# Patient Record
Sex: Male | Born: 1935 | Race: White | Hispanic: No | Marital: Married | Smoking: Never smoker
Health system: Southern US, Community
[De-identification: ages and names within clinical notes are randomized; demographics above are authoritative.]

## PROBLEM LIST (undated history)

## (undated) DIAGNOSIS — N400 Enlarged prostate without lower urinary tract symptoms: Secondary | ICD-10-CM

## (undated) DIAGNOSIS — E7800 Pure hypercholesterolemia, unspecified: Secondary | ICD-10-CM

## (undated) DIAGNOSIS — S065X9A Traumatic subdural hemorrhage with loss of consciousness of unspecified duration, initial encounter: Secondary | ICD-10-CM

## (undated) DIAGNOSIS — H409 Unspecified glaucoma: Secondary | ICD-10-CM

## (undated) DIAGNOSIS — N200 Calculus of kidney: Secondary | ICD-10-CM

## (undated) DIAGNOSIS — I10 Essential (primary) hypertension: Secondary | ICD-10-CM

## (undated) DIAGNOSIS — D751 Secondary polycythemia: Secondary | ICD-10-CM

## (undated) DIAGNOSIS — F0781 Postconcussional syndrome: Secondary | ICD-10-CM

## (undated) HISTORY — DX: Benign prostatic hyperplasia without lower urinary tract symptoms: N40.0

## (undated) HISTORY — DX: Secondary polycythemia: D75.1

## (undated) HISTORY — DX: Unspecified glaucoma: H40.9

## (undated) HISTORY — PX: CATARACT EXTRACTION: SUR2

## (undated) HISTORY — PX: PAROTIDECTOMY: SUR1003

## (undated) HISTORY — DX: Traumatic subdural hemorrhage with loss of consciousness of unspecified duration, initial encounter: S06.5X9A

## (undated) HISTORY — PX: REFRACTIVE SURGERY: SHX103

## (undated) HISTORY — DX: Postconcussional syndrome: F07.81

## (undated) HISTORY — DX: Pure hypercholesterolemia, unspecified: E78.00

## (undated) HISTORY — DX: Essential (primary) hypertension: I10

## (undated) HISTORY — DX: Calculus of kidney: N20.0

## (undated) HISTORY — PX: COLONOSCOPY: SHX174

## (undated) HISTORY — PX: OTHER SURGICAL HISTORY: SHX169

---
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
## 1998-03-08 HISTORY — PX: KIDNEY STONE SURGERY: SHX686

## 2014-03-08 HISTORY — PX: VEIN LIGATION: SHX2652

## 2015-11-07 ENCOUNTER — Encounter: Payer: Self-pay | Admitting: Neurology

## 2015-11-07 ENCOUNTER — Ambulatory Visit (INDEPENDENT_AMBULATORY_CARE_PROVIDER_SITE_OTHER): Payer: Medicare Other | Admitting: Neurology

## 2015-11-07 VITALS — BP 148/71 | HR 63 | Ht 69.0 in | Wt 165.5 lb

## 2015-11-07 DIAGNOSIS — E538 Deficiency of other specified B group vitamins: Secondary | ICD-10-CM

## 2015-11-07 DIAGNOSIS — S065XAA Traumatic subdural hemorrhage with loss of consciousness status unknown, initial encounter: Secondary | ICD-10-CM | POA: Insufficient documentation

## 2015-11-07 DIAGNOSIS — F0781 Postconcussional syndrome: Secondary | ICD-10-CM | POA: Diagnosis not present

## 2015-11-07 DIAGNOSIS — I6200 Nontraumatic subdural hemorrhage, unspecified: Secondary | ICD-10-CM

## 2015-11-07 DIAGNOSIS — S065X9A Traumatic subdural hemorrhage with loss of consciousness of unspecified duration, initial encounter: Secondary | ICD-10-CM

## 2015-11-07 DIAGNOSIS — R269 Unspecified abnormalities of gait and mobility: Secondary | ICD-10-CM

## 2015-11-07 HISTORY — DX: Postconcussional syndrome: F07.81

## 2015-11-07 HISTORY — DX: Traumatic subdural hemorrhage with loss of consciousness of unspecified duration, initial encounter: S06.5X9A

## 2015-11-07 HISTORY — DX: Traumatic subdural hemorrhage with loss of consciousness status unknown, initial encounter: S06.5XAA

## 2015-11-07 NOTE — Progress Notes (Signed)
Reason for visit: Concussion  Referring physician: Dr. Dorris Singh GENTLE HOGE is a 81 y.o. male  History of present illness:  Mr. Delapena is a 81 year old left-handed white male with a history of a fall that occurred around 08/03/2015. The patient was helping to move a potted plant, and fell down some stairs, he struck his right brow. The patient did not lose consciousness, but he was somewhat dazed for about 15 minutes. The patient went to the hospital and a CT scan of the head was done and apparently was unremarkable according to the patient. The patient began having onset of true vertigo the following day, and returned to emergency room but another CT scan was not done. The patient had some nausea associated with this, but he denied any headache. The patient has been treated with meclizine and Epley maneuvers. The true vertigo has gone away, but the patient has a sensation of dizziness or imbalance with walking, particularly when he is making a turn. He has not had any falls. Within the last week or so, he has begun to have headaches in the right occipital area. The patient has not had any overt confusion or memory problems, he has had some difficulty sleeping within the last 1 or 2 weeks. He denies any numbness or weakness of the face, arms, or legs, but he does have a sensation of some clumsiness of the legs. The patient denies any issues controlling the bowels or the bladder. The patient is not operating a motor vehicle at this time. The patient underwent a MRI of the brain on 09/18/2015, the written report is available to me. 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 The patient is sent to this office for further evaluation. The patient has been taken off of his aspirin.   Past Medical  History:  Diagnosis Date  . High cholesterol   . Hypertension     Past Surgical History:  Procedure Laterality Date  . BRAIN SURGERY  1949  . CATARACT EXTRACTION  2013-14  . KIDNEY STONE SURGERY  2000  . TONSILLECTOMY  1944  . VEIN LIGATION  2016    Family History  Problem Relation Age of Onset  . High blood pressure Mother   . High Cholesterol Mother     Social history:  reports that he has never smoked. He has never used smokeless tobacco. He reports that he does not drink alcohol or use drugs.  Medications:  Prior to Admission medications   Medication Sig Start Date End Date Taking? Authorizing Provider  amLODipine (NORVASC) 2.5 MG tablet  08/12/15  Yes Historical Provider, MD  lisinopril (PRINIVIL,ZESTRIL) 20 MG tablet Take 20 mg by mouth daily.   Yes Historical Provider, MD  meclizine (ANTIVERT) 12.5 MG tablet  09/29/15  Yes Historical Provider, MD  rosuvastatin (CRESTOR) 20 MG tablet  09/22/15  Yes Historical Provider, MD     Not on File  ROS:  Out of a complete 14 system review of symptoms, the patient complains only of the following symptoms, and all other reviewed systems are negative.  Double vision Easy bleeding Headache, dizziness Insomnia  Blood pressure (!) 148/71, pulse 63, height 5\' 9"  (1.753 m), weight 165 lb 8 oz (75.1 kg).  Physical Exam  General: The patient is  alert and cooperative at the time of the examination.  Eyes: Pupils are equal, round, and reactive to light. Discs are flat bilaterally.  Neck: The neck is supple, no carotid bruits are noted.  Respiratory: The respiratory examination is clear.  Cardiovascular: The cardiovascular examination reveals a regular rate and rhythm, no obvious murmurs or rubs are noted.  Skin: Extremities are without significant edema.  Neurologic Exam  Mental status: The patient is alert and oriented x 3 at the time of the examination. The patient has apparent normal recent and remote memory, with an  apparently normal attention span and concentration ability.  Cranial nerves: Facial symmetry is present. There is good sensation of the face to pinprick and soft touch bilaterally. The strength of the facial muscles and the muscles to head turning and shoulder shrug are normal bilaterally. Speech is well enunciated, no aphasia or dysarthria is noted. Extraocular movements are full. Visual fields are full. The tongue is midline, and the patient has symmetric elevation of the soft palate. No obvious hearing deficits are noted.  Motor: The motor testing reveals 5 over 5 strength of all 4 extremities. Good symmetric motor tone is noted throughout.  Sensory: Sensory testing is intact to pinprick, soft touch, vibration sensation, and position sense on all 4 extremities, with exception of some decreased position sense in the left foot. No evidence of extinction is noted.  Coordination: Cerebellar testing reveals good finger-nose-finger and heel-to-shin bilaterally. The patient appears to have apraxia with the use of the extremities, particularly the lower extremities.  Gait and station: Gait is normal. Tandem gait is slightly unsteady. Romberg is negative. No drift is seen.  Reflexes: Deep tendon reflexes are symmetric and normal bilaterally. Toes are downgoing bilaterally.   Assessment/Plan:  1. Post concussive syndrome  2. Post concussive vertigo, resolved  3. Gait instability, sensation of imbalance  4. Bilateral subdural hematoma  The patient has developed onset of a right occipital headache within the last week, this may indicate an enlargement of the subdural hematoma. The patient will be sent back for a CT of the head. He will be sent for physical therapy for gait training. His reports of dizziness appear to correlate with a sensation of imbalance, not true vertigo. He will be sent for some blood work looking for metabolic etiologies of gait disturbances. The patient will follow-up in 2  months. I have recommended that he use a cane for ambulation outside the house.  Marlan Palau. Keith Treylan Mcclintock MD 11/07/2015 8:16 AM  [facility] [address] [place], [place] [id number]  Phone [phone] Fax [phone]

## 2015-11-12 LAB — COPPER, SERUM: COPPER: 71 ug/dL — AB (ref 72–166)

## 2015-11-12 LAB — METHYLMALONIC ACID, SERUM: Methylmalonic Acid: 240 nmol/L (ref 0–378)

## 2015-11-12 LAB — VITAMIN B12: VITAMIN B 12: 320 pg/mL (ref 211–946)

## 2015-11-13 ENCOUNTER — Telehealth: Payer: Self-pay

## 2015-11-13 NOTE — Telephone Encounter (Signed)
Called pt w/ unremarkable lab results. Advised to start OTC MVI w/ copper supplement. Verbalized understanding and appreciation for call. Lab results and instructions mailed to pt as requested.

## 2015-11-13 NOTE — Telephone Encounter (Signed)
-----   Message from York Spanielharles K Willis, MD sent at 11/12/2015  5:29 PM EDT ----- Blood work is essentially unremarkable, minimally low copper level, would recommend going on a multivitamin with copper supplementation. Please call the patient. ----- Message ----- From: Nell RangeInterface, [facility] Lab Results In Sent: 11/08/2015   7:41 AM To: York Spanielharles K Willis, MD

## 2015-11-14 ENCOUNTER — Encounter: Payer: Self-pay | Admitting: Neurology

## 2015-11-14 ENCOUNTER — Encounter (HOSPITAL_COMMUNITY): Payer: Self-pay | Admitting: *Deleted

## 2015-11-14 ENCOUNTER — Inpatient Hospital Stay (HOSPITAL_COMMUNITY)
Admission: EM | Admit: 2015-11-14 | Discharge: 2015-11-19 | DRG: 027 | Disposition: A | Discharge status: Home / Self Care | Payer: Medicare Other | Attending: Neurosurgery | Admitting: Neurosurgery

## 2015-11-14 ENCOUNTER — Emergency Department (HOSPITAL_COMMUNITY): Payer: Medicare Other

## 2015-11-14 ENCOUNTER — Telehealth: Payer: Self-pay | Admitting: Neurology

## 2015-11-14 DIAGNOSIS — I6203 Nontraumatic chronic subdural hemorrhage: Secondary | ICD-10-CM | POA: Diagnosis not present

## 2015-11-14 DIAGNOSIS — R402143 Coma scale, eyes open, spontaneous, at hospital admission: Secondary | ICD-10-CM | POA: Diagnosis present

## 2015-11-14 DIAGNOSIS — I10 Essential (primary) hypertension: Secondary | ICD-10-CM | POA: Diagnosis present

## 2015-11-14 DIAGNOSIS — R402363 Coma scale, best motor response, obeys commands, at hospital admission: Secondary | ICD-10-CM | POA: Diagnosis present

## 2015-11-14 DIAGNOSIS — E7800 Pure hypercholesterolemia, unspecified: Secondary | ICD-10-CM | POA: Diagnosis present

## 2015-11-14 DIAGNOSIS — S065X9A Traumatic subdural hemorrhage with loss of consciousness of unspecified duration, initial encounter: Secondary | ICD-10-CM | POA: Diagnosis present

## 2015-11-14 DIAGNOSIS — R4182 Altered mental status, unspecified: Secondary | ICD-10-CM | POA: Diagnosis not present

## 2015-11-14 DIAGNOSIS — Z01818 Encounter for other preprocedural examination: Secondary | ICD-10-CM

## 2015-11-14 DIAGNOSIS — R402253 Coma scale, best verbal response, oriented, at hospital admission: Secondary | ICD-10-CM | POA: Diagnosis present

## 2015-11-14 DIAGNOSIS — S065XAA Traumatic subdural hemorrhage with loss of consciousness status unknown, initial encounter: Secondary | ICD-10-CM | POA: Diagnosis present

## 2015-11-14 LAB — COMPREHENSIVE METABOLIC PANEL
ALBUMIN: 3.8 g/dL (ref 3.5–5.0)
ALK PHOS: 70 U/L (ref 38–126)
ALT: 19 U/L (ref 17–63)
AST: 23 U/L (ref 15–41)
Anion gap: 8 (ref 5–15)
BUN: 16 mg/dL (ref 6–20)
CALCIUM: 9.4 mg/dL (ref 8.9–10.3)
CO2: 25 mmol/L (ref 22–32)
CREATININE: 1.08 mg/dL (ref 0.61–1.24)
Chloride: 105 mmol/L (ref 101–111)
GFR calc Af Amer: >60 mL/min (ref >60)
GFR calc non Af Amer: >60 mL/min (ref >60)
GLUCOSE: 85 mg/dL (ref 65–99)
Potassium: 4.2 mmol/L (ref 3.5–5.1)
SODIUM: 138 mmol/L (ref 135–145)
Total Bilirubin: 1 mg/dL (ref 0.3–1.2)
Total Protein: 6.4 g/dL — ABNORMAL LOW (ref 6.5–8.1)

## 2015-11-14 LAB — CBC WITH DIFFERENTIAL/PLATELET
BASOS PCT: 0 %
Basophils Absolute: 0 10*3/uL (ref 0.0–0.1)
EOS ABS: 0.1 10*3/uL (ref 0.0–0.7)
Eosinophils Relative: 1 %
HCT: 44.5 % (ref 39.0–52.0)
HEMOGLOBIN: 15.4 g/dL (ref 13.0–17.0)
LYMPHS ABS: 1.9 10*3/uL (ref 0.7–4.0)
Lymphocytes Relative: 23 %
MCH: 29.5 pg (ref 26.0–34.0)
MCHC: 34.6 g/dL (ref 30.0–36.0)
MCV: 85.2 fL (ref 78.0–100.0)
Monocytes Absolute: 0.5 10*3/uL (ref 0.1–1.0)
Monocytes Relative: 6 %
NEUTROS ABS: 5.7 10*3/uL (ref 1.7–7.7)
NEUTROS PCT: 70 %
Platelets: 266 10*3/uL (ref 150–400)
RBC: 5.22 MIL/uL (ref 4.22–5.81)
RDW: 13.9 % (ref 11.5–15.5)
WBC: 8.2 10*3/uL (ref 4.0–10.5)

## 2015-11-14 LAB — PROTIME-INR
INR: 0.96
Prothrombin Time: 12.8 seconds (ref 11.4–15.2)

## 2015-11-14 NOTE — Telephone Encounter (Addendum)
Called from [facility] for by report acute on chronic bilateral subdural hematomas. [facility] colections measured 1cm bilaterally maximal diameter now 1.9 and 1.8 cm bilaterally without midline shift but this is mild mass effect. IOn the right extends from the mid vertex to the anterior frontotemporal area. On the left extends from the vertex to the frontotemporal area do not have the images and report is verbal, this was relayed by the director of the center. No mental status changes. He is however having dull headaches, changes in gait. I have advised patient and wife to go to the emergency room and bring images.

## 2015-11-14 NOTE — ED Triage Notes (Signed)
Headaches mild  For 1-2 weeks.  He fell may 2017  And he was diagnosed with sah    For 1-2 weeks he has been dizzy with a gait problem   No confusion    His last c-t was earlier today and his wife was called and told to bring him toi the ed because he has 2 sdh now.

## 2015-11-14 NOTE — ED Provider Notes (Signed)
[facility]-EMERGENCY DEPT Provider Note   CSN: [id number] Arrival date & time: 11/14/15  1950     History   Chief Complaint Chief Complaint  Patient presents with  . Head Injury    HPI Benjamin [name] is a 81 y.o. male.  HPI Pt was seen at 2035. Per pt and his wife, c/o gradual onset and persistence of worsening "SDH" over the past 4 months. Pt fell 08/03/15 and had CT head at OSH read as negative. Pt had persistent "dizziness" over the next weeks, and f/u with PMD. PMD dx concussion. Pt returned to OSH and had repeat CT-H that was again negative. Pt f/u with Neuro MD (Dr. Anne Hahn) last month, had MRI which revealed "one small area of SDH." Pt's wife states pt has been persistently "off balance" while walking, "dizzy" (unable to further describe), and have intermittent headaches over the past 1 week. Pt had repeat CT-H today at OSH and was told afterwards by his Neuro Dr. Anne Hahn to "go to [facility] because the bleeding in his head is growing and now there are 2 areas of SDH." Pt himself currently denies any complaints while sitting on the stretcher. Denies any further falls after the original fall. Denies CP/palpitations, no SOB/cough, no abd pain, no N/V/D, no slurred speech, no facial droop, no focal motor weakness, no tingling/numbness in extremities.    Past Medical History:  Diagnosis Date  . High cholesterol   . Hypertension   . Post concussion syndrome 11/07/2015  . Subdural hematoma (HCC) 11/07/2015    Patient Active Problem List   Diagnosis Date Noted  . Post concussion syndrome 11/07/2015  . Abnormality of gait 11/07/2015  . Subdural hematoma (HCC) 11/07/2015    Past Surgical History:  Procedure Laterality Date  . BRAIN SURGERY  1949  . CATARACT EXTRACTION  2013-14  . KIDNEY STONE SURGERY  2000  . TONSILLECTOMY  1944  . VEIN LIGATION  2016       Home Medications    Prior to Admission medications   Medication Sig Start Date End Date Taking? Authorizing Provider  amLODipine  (NORVASC) 2.5 MG tablet  08/12/15   Historical Provider, MD  lisinopril (PRINIVIL,ZESTRIL) 20 MG tablet Take 20 mg by mouth daily.    Historical Provider, MD  meclizine (ANTIVERT) 12.5 MG tablet  09/29/15   Historical Provider, MD  rosuvastatin (CRESTOR) 20 MG tablet  09/22/15   Historical Provider, MD    Family History Family History  Problem Relation Age of Onset  . High blood pressure Mother   . High Cholesterol Mother     Social History Social History  Substance Use Topics  . Smoking status: Never Smoker  . Smokeless tobacco: Never Used  . Alcohol use No     Allergies   Review of patient's allergies indicates not on file.   Review of Systems Review of Systems ROS: Statement: All systems negative except as marked or noted in the HPI; Constitutional: Negative for fever and chills. ; ; Eyes: Negative for eye pain, redness and discharge. ; ; ENMT: Negative for ear pain, hoarseness, nasal congestion, sinus pressure and sore throat. ; ; Cardiovascular: Negative for chest pain, palpitations, diaphoresis, dyspnea and peripheral edema. ; ; Respiratory: Negative for cough, wheezing and stridor. ; ; Gastrointestinal: Negative for nausea, vomiting, diarrhea, abdominal pain, blood in stool, hematemesis, jaundice and rectal bleeding. . ; ; Genitourinary: Negative for dysuria, flank pain and hematuria. ; ; Musculoskeletal: Negative for back pain and neck pain. Negative for swelling  and trauma.; ; Skin: Negative for pruritus, rash, abrasions, blisters, bruising and skin lesion.; ; Neuro: +"dizziness," ataxia, headache. Negative for neck stiffness. Negative for weakness, altered level of consciousness, altered mental status, extremity weakness, paresthesias, involuntary movement, seizure and syncope.      Physical Exam Updated Vital Signs BP 145/73   Pulse (!) 58   Temp 98.3 F (36.8 C)   Resp 17   Ht 5\' 9"  (1.753 m)   Wt 166 lb 8 oz (75.5 kg)   SpO2 95%   BMI 24.59 kg/m   Physical  Exam 2040: Physical examination:  Nursing notes reviewed; Vital signs and O2 SAT reviewed;  Constitutional: Well developed, Well nourished, Well hydrated, In no acute distress; Head:  Normocephalic, atraumatic; Eyes: EOMI, PERRL, No scleral icterus; ENMT: Mouth and pharynx normal, Mucous membranes moist; Neck: Supple, Full range of motion, No lymphadenopathy; Cardiovascular: Regular rate and rhythm, No gallop; Respiratory: Breath sounds clear & equal bilaterally, No wheezes.  Speaking full sentences with ease, Normal respiratory effort/excursion; Chest: Nontender, Movement normal; Abdomen: Soft, Nontender, Nondistended, Normal bowel sounds; Genitourinary: No CVA tenderness; Extremities: Pulses normal, No tenderness, No edema, No calf edema or asymmetry.; Neuro: AA&Ox3, Major CN grossly intact. Speech clear.  No facial droop. +L>R horizontal end gaze nystagmus. Grips equal. Strength 5/5 equal bilat UE's and LE's.  DTR 2/4 equal bilat UE's and LE's.  No gross sensory deficits.  Normal cerebellar testing bilat UE's (finger-nose) and LE's (heel-shin)..; Skin: Color normal, Warm, Dry.   ED Treatments / Results  Labs (all labs ordered are listed, but only abnormal results are displayed)   EKG  EKG Interpretation None       Radiology   Procedures Procedures (including critical care time)  Medications Ordered in ED Medications - No data to display   Initial Impression / Assessment and Plan / ED Course  I have reviewed the triage vital signs and the nursing notes.  Pertinent labs & imaging results that were available during my care of the patient were reviewed by me and considered in my medical decision making (see chart for details).  MDM Reviewed: previous chart, nursing note and vitals Reviewed previous: labs Interpretation: labs and CT scan Total time providing critical care: 30-74 minutes. This excludes time spent performing separately reportable procedures and services. Consults:  neurosurgery   CRITICAL CARE Performed by: Laray Anger Total critical care time: 35 minutes Critical care time was exclusive of separately billable procedures and treating other patients. Critical care was necessary to treat or prevent imminent or life-threatening deterioration. Critical care was time spent personally by me on the following activities: development of treatment plan with patient and/or surrogate as well as nursing, discussions with consultants, evaluation of patient's response to treatment, examination of patient, obtaining history from patient or surrogate, ordering and performing treatments and interventions, ordering and review of laboratory studies, ordering and review of radiographic studies, pulse oximetry and re-evaluation of patient's condition.   Results for orders placed or performed during the hospital encounter of 11/14/15  CBC with Differential  Result Value Ref Range   WBC 8.2 4.0 - 10.5 K/uL   RBC 5.22 4.22 - 5.81 MIL/uL   Hemoglobin 15.4 13.0 - 17.0 g/dL   HCT 16.1 09.6 - 04.5 %   MCV 85.2 78.0 - 100.0 fL   MCH 29.5 26.0 - 34.0 pg   MCHC 34.6 30.0 - 36.0 g/dL   RDW 40.9 81.1 - 91.4 %   Platelets 266 150 - 400 K/uL  Neutrophils Relative % 70 %   Neutro Abs 5.7 1.7 - 7.7 K/uL   Lymphocytes Relative 23 %   Lymphs Abs 1.9 0.7 - 4.0 K/uL   Monocytes Relative 6 %   Monocytes Absolute 0.5 0.1 - 1.0 K/uL   Eosinophils Relative 1 %   Eosinophils Absolute 0.1 0.0 - 0.7 K/uL   Basophils Relative 0 %   Basophils Absolute 0.0 0.0 - 0.1 K/uL  Comprehensive metabolic panel  Result Value Ref Range   Sodium 138 135 - 145 mmol/L   Potassium 4.2 3.5 - 5.1 mmol/L   Chloride 105 101 - 111 mmol/L   CO2 25 22 - 32 mmol/L   Glucose, Bld 85 65 - 99 mg/dL   BUN 16 6 - 20 mg/dL   Creatinine, Ser 0.981.08 0.61 - 1.24 mg/dL   Calcium 9.4 8.9 - 11.910.3 mg/dL   Total Protein 6.4 (L) 6.5 - 8.1 g/dL   Albumin 3.8 3.5 - 5.0 g/dL   AST 23 15 - 41 U/L   ALT 19 17 - 63 U/L     Alkaline Phosphatase 70 38 - 126 U/L   Total Bilirubin 1.0 0.3 - 1.2 mg/dL   GFR calc non Af Amer >60 >60 mL/min   GFR calc Af Amer >60 >60 mL/min   Anion gap 8 5 - 15  Protime-INR  Result Value Ref Range   Prothrombin Time 12.8 11.4 - 15.2 seconds   INR 0.96     Ct Head Wo Contrast Result Date: 11/14/2015 CLINICAL DATA:  Frequent falls for 1 month. History of post concussion syndrome and subdural hematoma. EXAM: CT HEAD WITHOUT CONTRAST TECHNIQUE: Contiguous axial images were obtained from the base of the skull through the vertex without intravenous contrast. COMPARISON:  None. FINDINGS: BRAIN: Bilateral holo hemispheric heterogeneously dense subdural hematomas measuring up to 16 mm on the RIGHT, 15 mm on the LEFT. Linear possible membrane formation bilaterally. Subjacent sulcal effacement. No midline shift. No intraparenchymal hemorrhage or acute large vascular territory infarcts. Basal cisterns are patent. RIGHT cerebellar encephalomalacia. VASCULAR: Moderate calcific atherosclerosis of the carotid siphons. SKULL: No skull fracture. No significant scalp soft tissue swelling. SINUSES/ORBITS: The included ocular globes and orbital contents are non-suspicious. Status post bilateral ocular lens implants. The mastoid air-cells and included paranasal sinuses are well-aerated. OTHER: None. IMPRESSION: Acute on chronic large bilateral holo hemispheric subdural hematomas with similar mass effect, no midline shift. 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 Acute findings discussed with and reconfirmed by Dr.Jeyda Siebel on 11/14/2015 at 10:45 pm. Electronically Signed   By: Awilda Metroourtnay  Bloomer M.D.   On: 11/14/2015 22:49     2315:  CT scan as above.  Dx and testing d/w pt and family.  Questions answered.  Verb understanding, agreeable to admit.  T/C to Neurosurgery Dr. Wynetta Emeryram, case discussed, including:  HPI, pertinent PM/SHx, VS/PE, dx testing, ED course and treatment: will review CT images  and come to ED for evaluation.  2345:  Neurosurgery Dr. Wynetta Emeryram has come to the ED for evaluation: he will admit pt to ICU/18M.      Final Clinical Impressions(s) / ED Diagnoses   Final diagnoses:  None    New Prescriptions New Prescriptions   No medications on file     Samuel JesterKathleen Mina Babula, DO 11/18/15 1637

## 2015-11-15 ENCOUNTER — Encounter (HOSPITAL_COMMUNITY)
Admission: EM | Disposition: A | Discharge status: Home / Self Care | Payer: Self-pay | Source: Home / Self Care | Attending: Neurosurgery

## 2015-11-15 ENCOUNTER — Encounter (HOSPITAL_COMMUNITY): Payer: Self-pay | Admitting: Certified Registered"

## 2015-11-15 ENCOUNTER — Inpatient Hospital Stay (HOSPITAL_COMMUNITY): Payer: Medicare Other | Admitting: Certified Registered"

## 2015-11-15 ENCOUNTER — Inpatient Hospital Stay (HOSPITAL_COMMUNITY): Payer: Medicare Other

## 2015-11-15 DIAGNOSIS — S065XAA Traumatic subdural hemorrhage with loss of consciousness status unknown, initial encounter: Secondary | ICD-10-CM | POA: Diagnosis present

## 2015-11-15 DIAGNOSIS — R402363 Coma scale, best motor response, obeys commands, at hospital admission: Secondary | ICD-10-CM | POA: Diagnosis present

## 2015-11-15 DIAGNOSIS — I6203 Nontraumatic chronic subdural hemorrhage: Secondary | ICD-10-CM | POA: Diagnosis present

## 2015-11-15 DIAGNOSIS — S065X9A Traumatic subdural hemorrhage with loss of consciousness of unspecified duration, initial encounter: Secondary | ICD-10-CM | POA: Diagnosis present

## 2015-11-15 DIAGNOSIS — R4182 Altered mental status, unspecified: Secondary | ICD-10-CM | POA: Diagnosis present

## 2015-11-15 DIAGNOSIS — I10 Essential (primary) hypertension: Secondary | ICD-10-CM | POA: Diagnosis present

## 2015-11-15 DIAGNOSIS — E7800 Pure hypercholesterolemia, unspecified: Secondary | ICD-10-CM | POA: Diagnosis present

## 2015-11-15 DIAGNOSIS — R402253 Coma scale, best verbal response, oriented, at hospital admission: Secondary | ICD-10-CM | POA: Diagnosis present

## 2015-11-15 DIAGNOSIS — R402143 Coma scale, eyes open, spontaneous, at hospital admission: Secondary | ICD-10-CM | POA: Diagnosis present

## 2015-11-15 HISTORY — PX: BURR HOLE: SHX908

## 2015-11-15 LAB — CBC
HEMATOCRIT: 44.1 % (ref 39.0–52.0)
HEMOGLOBIN: 15.1 g/dL (ref 13.0–17.0)
MCH: 29 pg (ref 26.0–34.0)
MCHC: 34.2 g/dL (ref 30.0–36.0)
MCV: 84.6 fL (ref 78.0–100.0)
Platelets: 245 10*3/uL (ref 150–400)
RBC: 5.21 MIL/uL (ref 4.22–5.81)
RDW: 13.9 % (ref 11.5–15.5)
WBC: 8.2 10*3/uL (ref 4.0–10.5)

## 2015-11-15 LAB — PROTIME-INR
INR: 1.05
Prothrombin Time: 13.7 seconds (ref 11.4–15.2)

## 2015-11-15 LAB — MRSA PCR SCREENING: MRSA by PCR: NEGATIVE

## 2015-11-15 SURGERY — CREATION, CRANIAL BURR HOLE
Anesthesia: General | Site: Head | Laterality: Bilateral

## 2015-11-15 MED ORDER — ROCURONIUM BROMIDE 10 MG/ML (PF) SYRINGE
PREFILLED_SYRINGE | INTRAVENOUS | Status: AC
  Filled 2015-11-15: qty 10

## 2015-11-15 MED ORDER — HALOPERIDOL LACTATE 5 MG/ML IJ SOLN: 2.0000 mg | Freq: Once | INTRAMUSCULAR | Status: DC

## 2015-11-15 MED ORDER — CEFAZOLIN SODIUM-DEXTROSE 2-4 GM/100ML-% IV SOLN
2.0000 g | Freq: Three times a day (TID) | INTRAVENOUS | Status: AC
  Administered 2015-11-15 (×2): 2 g via INTRAVENOUS
  Filled 2015-11-15 (×2): qty 100

## 2015-11-15 MED ORDER — PHENYLEPHRINE HCL 10 MG/ML IJ SOLN
INTRAVENOUS | Status: DC | PRN
  Administered 2015-11-15: 20 ug/min via INTRAVENOUS

## 2015-11-15 MED ORDER — GLYCOPYRROLATE 0.2 MG/ML IV SOSY
PREFILLED_SYRINGE | INTRAVENOUS | Status: AC
  Filled 2015-11-15: qty 3

## 2015-11-15 MED ORDER — PHENYLEPHRINE 40 MCG/ML (10ML) SYRINGE FOR IV PUSH (FOR BLOOD PRESSURE SUPPORT)
PREFILLED_SYRINGE | INTRAVENOUS | Status: AC
  Filled 2015-11-15: qty 10

## 2015-11-15 MED ORDER — THROMBIN 20000 UNITS EX SOLR
CUTANEOUS | Status: DC | PRN
  Administered 2015-11-15: 20 mL via TOPICAL

## 2015-11-15 MED ORDER — ACETAMINOPHEN 325 MG PO TABS: 650.0000 mg | ORAL_TABLET | ORAL | Status: DC | PRN

## 2015-11-15 MED ORDER — LABETALOL HCL 5 MG/ML IV SOLN: 10.0000 mg | INTRAVENOUS | Status: DC | PRN

## 2015-11-15 MED ORDER — CEFAZOLIN SODIUM 1 G IJ SOLR
INTRAMUSCULAR | Status: DC | PRN
  Administered 2015-11-15: 2 g via INTRAMUSCULAR

## 2015-11-15 MED ORDER — FENTANYL CITRATE (PF) 100 MCG/2ML IJ SOLN
INTRAMUSCULAR | Status: AC
  Filled 2015-11-15: qty 2

## 2015-11-15 MED ORDER — DOCUSATE SODIUM 100 MG PO CAPS
100.0000 mg | ORAL_CAPSULE | Freq: Two times a day (BID) | ORAL | Status: DC
  Administered 2015-11-15 – 2015-11-19 (×8): 100 mg via ORAL
  Filled 2015-11-15 (×8): qty 1

## 2015-11-15 MED ORDER — FENTANYL CITRATE (PF) 100 MCG/2ML IJ SOLN
INTRAMUSCULAR | Status: DC | PRN
  Administered 2015-11-15 (×4): 50 ug via INTRAVENOUS

## 2015-11-15 MED ORDER — SODIUM CHLORIDE 0.9 % IR SOLN
Status: DC | PRN
  Administered 2015-11-15: 500 mL

## 2015-11-15 MED ORDER — SUGAMMADEX SODIUM 200 MG/2ML IV SOLN
INTRAVENOUS | Status: AC
  Filled 2015-11-15: qty 2

## 2015-11-15 MED ORDER — GLYCOPYRROLATE 0.2 MG/ML IJ SOLN
INTRAMUSCULAR | Status: DC | PRN
  Administered 2015-11-15: 0.2 mg via INTRAVENOUS

## 2015-11-15 MED ORDER — ROSUVASTATIN CALCIUM 20 MG PO TABS
20.0000 mg | ORAL_TABLET | Freq: Every day | ORAL | Status: DC
Start: 2015-11-15 — End: 2015-11-19
  Administered 2015-11-15 – 2015-11-18 (×5): 20 mg via ORAL
  Filled 2015-11-15 (×5): qty 1

## 2015-11-15 MED ORDER — FENTANYL CITRATE (PF) 100 MCG/2ML IJ SOLN: 25.0000 ug | INTRAMUSCULAR | Status: DC | PRN

## 2015-11-15 MED ORDER — PROPOFOL 10 MG/ML IV BOLUS
INTRAVENOUS | Status: DC | PRN
  Administered 2015-11-15: 100 mg via INTRAVENOUS

## 2015-11-15 MED ORDER — HYDROMORPHONE HCL 1 MG/ML IJ SOLN: 0.5000 mg | INTRAMUSCULAR | Status: DC | PRN

## 2015-11-15 MED ORDER — CEFAZOLIN SODIUM 1 G IJ SOLR
INTRAMUSCULAR | Status: AC
  Filled 2015-11-15: qty 20

## 2015-11-15 MED ORDER — FAMOTIDINE IN NACL 20-0.9 MG/50ML-% IV SOLN
20.0000 mg | Freq: Two times a day (BID) | INTRAVENOUS | Status: DC
  Administered 2015-11-15 – 2015-11-16 (×3): 20 mg via INTRAVENOUS
  Filled 2015-11-15 (×4): qty 50

## 2015-11-15 MED ORDER — PROMETHAZINE HCL 25 MG PO TABS: 12.5000 mg | ORAL_TABLET | ORAL | Status: DC | PRN

## 2015-11-15 MED ORDER — SODIUM CHLORIDE 0.9 % IV SOLN
500.0000 mg | Freq: Two times a day (BID) | INTRAVENOUS | Status: DC
  Administered 2015-11-15 – 2015-11-16 (×3): 500 mg via INTRAVENOUS
  Filled 2015-11-15 (×5): qty 5

## 2015-11-15 MED ORDER — SUGAMMADEX SODIUM 200 MG/2ML IV SOLN
INTRAVENOUS | Status: DC | PRN
  Administered 2015-11-15: 150 mg via INTRAVENOUS

## 2015-11-15 MED ORDER — CEFUROXIME AXETIL 250 MG PO TABS: 250.0000 mg | ORAL_TABLET | Freq: Two times a day (BID) | ORAL | Status: DC

## 2015-11-15 MED ORDER — SODIUM CHLORIDE 0.9 % IV SOLN: INTRAVENOUS | Status: DC

## 2015-11-15 MED ORDER — SACCHAROMYCES BOULARDII 250 MG PO CAPS: 250.0000 mg | ORAL_CAPSULE | Freq: Two times a day (BID) | ORAL | Status: DC

## 2015-11-15 MED ORDER — MORPHINE SULFATE (PF) 2 MG/ML IV SOLN
1.0000 mg | INTRAVENOUS | Status: DC | PRN
  Administered 2015-11-15: 1 mg via INTRAVENOUS
  Filled 2015-11-15: qty 1

## 2015-11-15 MED ORDER — AMLODIPINE BESYLATE 2.5 MG PO TABS
2.5000 mg | ORAL_TABLET | Freq: Every day | ORAL | Status: DC
  Administered 2015-11-15 – 2015-11-19 (×5): 2.5 mg via ORAL
  Filled 2015-11-15 (×5): qty 1

## 2015-11-15 MED ORDER — SODIUM CHLORIDE 0.9 % IV SOLN
INTRAVENOUS | Status: DC | PRN
  Administered 2015-11-15: 07:00:00 via INTRAVENOUS

## 2015-11-15 MED ORDER — ONDANSETRON HCL 4 MG/2ML IJ SOLN
INTRAMUSCULAR | Status: DC | PRN
  Administered 2015-11-15: 4 mg via INTRAVENOUS

## 2015-11-15 MED ORDER — LIDOCAINE HCL 1 % IJ SOLN
INTRAMUSCULAR | Status: DC | PRN
  Administered 2015-11-15: 7 mL via INTRADERMAL

## 2015-11-15 MED ORDER — PROPOFOL 10 MG/ML IV BOLUS
INTRAVENOUS | Status: AC
  Filled 2015-11-15: qty 20

## 2015-11-15 MED ORDER — ROCURONIUM BROMIDE 100 MG/10ML IV SOLN
INTRAVENOUS | Status: DC | PRN
  Administered 2015-11-15: 50 mg via INTRAVENOUS
  Administered 2015-11-15: 10 mg via INTRAVENOUS

## 2015-11-15 MED ORDER — EPHEDRINE SULFATE 50 MG/ML IJ SOLN
INTRAMUSCULAR | Status: DC | PRN
  Administered 2015-11-15: 10 mg via INTRAVENOUS

## 2015-11-15 MED ORDER — 0.9 % SODIUM CHLORIDE (POUR BTL) OPTIME
TOPICAL | Status: DC | PRN
  Administered 2015-11-15: 1000 mL

## 2015-11-15 MED ORDER — LISINOPRIL 20 MG PO TABS
20.0000 mg | ORAL_TABLET | Freq: Every day | ORAL | Status: DC
Start: 2015-11-15 — End: 2015-11-19
  Administered 2015-11-15 – 2015-11-18 (×5): 20 mg via ORAL
  Filled 2015-11-15 (×5): qty 1

## 2015-11-15 MED ORDER — ACETAMINOPHEN 650 MG RE SUPP: 650.0000 mg | RECTAL | Status: DC | PRN

## 2015-11-15 MED ORDER — PHENYLEPHRINE HCL 10 MG/ML IJ SOLN
INTRAMUSCULAR | Status: DC | PRN
  Administered 2015-11-15: 80 ug via INTRAVENOUS

## 2015-11-15 MED ORDER — LIDOCAINE HCL (CARDIAC) 20 MG/ML IV SOLN
INTRAVENOUS | Status: DC | PRN
Start: 2015-11-15 — End: 2015-11-15
  Administered 2015-11-15: 60 mg via INTRAVENOUS

## 2015-11-15 MED ORDER — LIDOCAINE 2% (20 MG/ML) 5 ML SYRINGE
INTRAMUSCULAR | Status: AC
  Filled 2015-11-15: qty 5

## 2015-11-15 MED ORDER — FAMOTIDINE IN NACL 20-0.9 MG/50ML-% IV SOLN: 20.0000 mg | Freq: Two times a day (BID) | INTRAVENOUS | Status: DC

## 2015-11-15 MED ORDER — ONDANSETRON HCL 4 MG PO TABS: 4.0000 mg | ORAL_TABLET | ORAL | Status: DC | PRN

## 2015-11-15 MED ORDER — SODIUM CHLORIDE 0.9 % IV SOLN
INTRAVENOUS | Status: DC
  Administered 2015-11-15 (×2): via INTRAVENOUS

## 2015-11-15 MED ORDER — SENNOSIDES-DOCUSATE SODIUM 8.6-50 MG PO TABS
1.0000 | ORAL_TABLET | Freq: Two times a day (BID) | ORAL | Status: DC
  Administered 2015-11-15 – 2015-11-19 (×9): 1 via ORAL
  Filled 2015-11-15 (×9): qty 1

## 2015-11-15 MED ORDER — MECLIZINE HCL 12.5 MG PO TABS
12.5000 mg | ORAL_TABLET | Freq: Two times a day (BID) | ORAL | Status: DC | PRN
  Filled 2015-11-15: qty 1

## 2015-11-15 MED ORDER — ONDANSETRON HCL 4 MG/2ML IJ SOLN
INTRAMUSCULAR | Status: AC
  Filled 2015-11-15: qty 2

## 2015-11-15 MED ORDER — ONDANSETRON HCL 4 MG/2ML IJ SOLN: 4.0000 mg | INTRAMUSCULAR | Status: DC | PRN

## 2015-11-15 MED ORDER — FENTANYL CITRATE (PF) 100 MCG/2ML IJ SOLN
INTRAMUSCULAR | Status: AC
  Filled 2015-11-15: qty 4

## 2015-11-15 MED ORDER — HYDROCODONE-ACETAMINOPHEN 5-325 MG PO TABS
1.0000 | ORAL_TABLET | ORAL | Status: DC | PRN
Start: 2015-11-15 — End: 2015-11-19

## 2015-11-15 SURGICAL SUPPLY — 79 items
BAG DECANTER FOR FLEXI CONT (MISCELLANEOUS) ×4 IMPLANT
BANDAGE GAUZE 4  KLING STR (GAUZE/BANDAGES/DRESSINGS) IMPLANT
BIT DRILL WIRE PASS 1.3MM (BIT) ×2 IMPLANT
BLADE CLIPPER SURG (BLADE) IMPLANT
BLADE SURG 11 STRL SS (BLADE) ×4 IMPLANT
BNDG COHESIVE 4X5 TAN NS LF (GAUZE/BANDAGES/DRESSINGS) IMPLANT
BUR ACORN 9.0 PRECISION (BURR) ×3 IMPLANT
BUR ACORN 9.0MM PRECISION (BURR) ×1
BUR SPIRAL ROUTER 2.3 (BUR) IMPLANT
BUR SPIRAL ROUTER 2.3MM (BUR)
CANISTER SUCT 3000ML PPV (MISCELLANEOUS) ×4 IMPLANT
CLIP TI MEDIUM 6 (CLIP) IMPLANT
CORDS BIPOLAR (ELECTRODE) IMPLANT
COVER BACK TABLE 60X90IN (DRAPES) ×8 IMPLANT
DECANTER SPIKE VIAL GLASS SM (MISCELLANEOUS) ×8 IMPLANT
DERMABOND ADVANCED (GAUZE/BANDAGES/DRESSINGS) ×4
DERMABOND ADVANCED .7 DNX12 (GAUZE/BANDAGES/DRESSINGS) ×4 IMPLANT
DRAPE NEUROLOGICAL W/INCISE (DRAPES) ×4 IMPLANT
DRAPE SURG 17X23 STRL (DRAPES) IMPLANT
DRAPE WARM FLUID 44X44 (DRAPE) ×8 IMPLANT
DRILL WIRE PASS 1.3MM (BIT) ×4
ELECT CAUTERY BLADE 6.4 (BLADE) ×4 IMPLANT
ELECT REM PT RETURN 9FT ADLT (ELECTROSURGICAL) ×4
ELECTRODE REM PT RTRN 9FT ADLT (ELECTROSURGICAL) ×2 IMPLANT
GAUZE SPONGE 4X4 12PLY STRL (GAUZE/BANDAGES/DRESSINGS) ×4 IMPLANT
GAUZE SPONGE 4X4 16PLY XRAY LF (GAUZE/BANDAGES/DRESSINGS) IMPLANT
GLOVE BIO SURGEON STRL SZ 6.5 (GLOVE) ×3 IMPLANT
GLOVE BIO SURGEON STRL SZ7 (GLOVE) ×12 IMPLANT
GLOVE BIO SURGEON STRL SZ7.5 (GLOVE) IMPLANT
GLOVE BIO SURGEON STRL SZ8 (GLOVE) ×4 IMPLANT
GLOVE BIO SURGEON STRL SZ8.5 (GLOVE) IMPLANT
GLOVE BIO SURGEONS STRL SZ 6.5 (GLOVE) ×1
GLOVE BIOGEL M 8.0 STRL (GLOVE) IMPLANT
GLOVE ECLIPSE 6.5 STRL STRAW (GLOVE) IMPLANT
GLOVE ECLIPSE 7.0 STRL STRAW (GLOVE) IMPLANT
GLOVE ECLIPSE 7.5 STRL STRAW (GLOVE) IMPLANT
GLOVE ECLIPSE 8.0 STRL XLNG CF (GLOVE) IMPLANT
GLOVE ECLIPSE 8.5 STRL (GLOVE) IMPLANT
GLOVE EXAM NITRILE LRG STRL (GLOVE) IMPLANT
GLOVE EXAM NITRILE XL STR (GLOVE) IMPLANT
GLOVE EXAM NITRILE XS STR PU (GLOVE) IMPLANT
GLOVE INDICATOR 6.5 STRL GRN (GLOVE) IMPLANT
GLOVE INDICATOR 7.0 STRL GRN (GLOVE) IMPLANT
GLOVE INDICATOR 7.5 STRL GRN (GLOVE) IMPLANT
GLOVE INDICATOR 8.0 STRL GRN (GLOVE) IMPLANT
GLOVE INDICATOR 8.5 STRL (GLOVE) ×4 IMPLANT
GLOVE OPTIFIT SS 8.0 STRL (GLOVE) IMPLANT
GLOVE SURG SS PI 6.5 STRL IVOR (GLOVE) IMPLANT
GOWN STRL REUS W/ TWL LRG LVL3 (GOWN DISPOSABLE) ×4 IMPLANT
GOWN STRL REUS W/ TWL XL LVL3 (GOWN DISPOSABLE) ×2 IMPLANT
GOWN STRL REUS W/TWL 2XL LVL3 (GOWN DISPOSABLE) ×8 IMPLANT
GOWN STRL REUS W/TWL LRG LVL3 (GOWN DISPOSABLE) ×4
GOWN STRL REUS W/TWL XL LVL3 (GOWN DISPOSABLE) ×2
HEMOSTAT SURGICEL 2X14 (HEMOSTASIS) IMPLANT
HOOK DURA (MISCELLANEOUS) IMPLANT
KIT BASIN OR (CUSTOM PROCEDURE TRAY) ×4 IMPLANT
KIT ROOM TURNOVER OR (KITS) ×4 IMPLANT
NEEDLE HYPO 25X1 1.5 SAFETY (NEEDLE) ×4 IMPLANT
NS IRRIG 1000ML POUR BTL (IV SOLUTION) ×4 IMPLANT
PACK CRANIOTOMY (CUSTOM PROCEDURE TRAY) ×4 IMPLANT
PAD ARMBOARD 7.5X6 YLW CONV (MISCELLANEOUS) ×16 IMPLANT
PATTIES SURGICAL .25X.25 (GAUZE/BANDAGES/DRESSINGS) IMPLANT
PATTIES SURGICAL .5 X.5 (GAUZE/BANDAGES/DRESSINGS) IMPLANT
PATTIES SURGICAL .5 X3 (DISPOSABLE) IMPLANT
PATTIES SURGICAL 1X1 (DISPOSABLE) IMPLANT
PIN MAYFIELD SKULL DISP (PIN) IMPLANT
SPONGE NEURO XRAY DETECT 1X3 (DISPOSABLE) IMPLANT
SPONGE SURGIFOAM ABS GEL 100 (HEMOSTASIS) IMPLANT
STAPLER SKIN PROX WIDE 3.9 (STAPLE) IMPLANT
STAPLER VISISTAT 35W (STAPLE) ×8 IMPLANT
SUT ETHILON 3 0 FSL (SUTURE) IMPLANT
SUT NURALON 4 0 TR CR/8 (SUTURE) ×8 IMPLANT
SUT VIC AB 2-0 CT1 18 (SUTURE) ×4 IMPLANT
SYR CONTROL 10ML LL (SYRINGE) ×4 IMPLANT
TOWEL OR 17X24 6PK STRL BLUE (TOWEL DISPOSABLE) ×8 IMPLANT
TOWEL OR 17X26 10 PK STRL BLUE (TOWEL DISPOSABLE) ×4 IMPLANT
TRAY FOLEY W/METER SILVER 16FR (SET/KITS/TRAYS/PACK) IMPLANT
UNDERPAD 30X30 (UNDERPADS AND DIAPERS) IMPLANT
WATER STERILE IRR 1000ML POUR (IV SOLUTION) ×8 IMPLANT

## 2015-11-15 NOTE — Progress Notes (Signed)
Patient ID: Benjamin [name], male   DOB: 09-10-1934, 81 y.o.   MRN: [id number] Doing well this morning minimal headache  Awake alert oriented strength 5 out of 5 no pronator drift pupils are equal  OR for burr hole possible craniotomy for evacuation of subacute on chronic subdural hematoma. I've extensively gone over the risks and benefits of the procedure the patient as well as perioperative course expectations of outcome and alternatives of surgery and he understood and agreed to proceed forward.

## 2015-11-15 NOTE — Anesthesia Preprocedure Evaluation (Addendum)
Anesthesia Evaluation  Patient identified by MRN, date of birth, ID band Patient awake    Reviewed: Allergy & Precautions, H&P , NPO status , Patient's Chart, lab work & pertinent test results  Airway Mallampati: II  TM Distance: >3 FB Neck ROM: Full    Dental no notable dental hx. (+) Teeth Intact, Dental Advisory Given, Chipped,    Pulmonary neg pulmonary ROS,    Pulmonary exam normal breath sounds clear to auscultation       Cardiovascular hypertension, Pt. on medications  Rhythm:Regular Rate:Normal     Neuro/Psych Subdural hematoma negative psych ROS   GI/Hepatic negative GI ROS, Neg liver ROS,   Endo/Other  negative endocrine ROS  Renal/GU negative Renal ROS  negative genitourinary   Musculoskeletal   Abdominal   Peds  Hematology negative hematology ROS (+)   Anesthesia Other Findings   Reproductive/Obstetrics negative OB ROS                           Anesthesia Physical Anesthesia Plan  ASA: II  Anesthesia Plan: General   Post-op Pain Management:    Induction: Intravenous  Airway Management Planned: Oral ETT  Additional Equipment:   Intra-op Plan:   Post-operative Plan: Extubation in OR  Informed Consent: I have reviewed the patients History and Physical, chart, labs and discussed the procedure including the risks, benefits and alternatives for the proposed anesthesia with the patient or authorized representative who has indicated his/her understanding and acceptance.   Dental advisory given  Plan Discussed with: CRNA  Anesthesia Plan Comments:         Anesthesia Quick Evaluation

## 2015-11-15 NOTE — Op Note (Signed)
Preoperative diagnosis: Subacute on chronic subdural hematomas bilateral  Postoperative diagnosis: Same  Procedure: Bilateral bur hole craniectomies for evacuation of subacute on chronic subdural hematomas. With placement of a left-sided subdural drain.  Surgeon: Jillyn HiddenGary Danetta Prom  Anesthesia: Gen.  EBL: Middle  History of present illness: Patient is 81 year old gentleman has had 3 months of intermittent headaches dizziness and difficult walking workup has revealed enlarging subacute subdural hematomas patient by the emergency room last night was admitted and taken the OR recommended burr hole craniectomy for possible evacuation of subacute and chronic subdural hematomas. I extensively reviewed the risks and benefits of the operation the patient as well as perioperative course expectations of outcome and alternatives of surgery and he understood and agreed to proceed forward.  Operative procedure: Patient brought in the or was induced on general anesthesia positioned supine the neck in slight flexion to the strips were shaved along the superotemporal line prepped and draped in routine sterile fashion 2 incisions were made 2 bur holes were drilled dura was coagulated and incised in a cruciate fashion first working on the left subdural member was medially identified and this was opened up and was able to visualize cortex passing a red rubber catheter under irrigation throughout the entire left side hemisphere evacuated a lot of old and somewhat new liquid blood and this was all irrigated out until it was clear irrigant and impact this of [place] on the right side in a similar fashion up the dura in cruciate fashion again irrigated with a regular catheter was clear much less fluid under much less pressure on the patient's right side. Then the cortex was rated under the dural surface on the sides were did not leave a drain there I did leave a drain ventricular catheter on the left side so both the incisions with  interrupted Vicryl's and staples in the skin wounds were dressed the patient recovered in stable condition. Case all needle counts sponge counts were correct.

## 2015-11-15 NOTE — Anesthesia Postprocedure Evaluation (Signed)
Anesthesia Post Note  Patient: Benjamin [name]  Procedure(s) Performed: Procedure(s) (LRB): BURR HOLES (Bilateral)  Patient location during evaluation: PACU Anesthesia Type: General Level of consciousness: awake and alert Pain management: pain level controlled Vital Signs Assessment: post-procedure vital signs reviewed and stable Respiratory status: spontaneous breathing, nonlabored ventilation, respiratory function stable and patient connected to nasal cannula oxygen Cardiovascular status: blood pressure returned to baseline and stable Postop Assessment: no signs of nausea or vomiting Anesthetic complications: no    Last Vitals:  Vitals:   11/15/15 0915 11/15/15 0935  BP: (!) 151/138 (!) 146/93  Pulse: 96 95  Resp: 17 17  Temp:      Last Pain:  Vitals:   11/15/15 0200  TempSrc: Oral  PainSc:                  Myisha Pickerel,W. EDMOND

## 2015-11-15 NOTE — Transfer of Care (Signed)
Immediate Anesthesia Transfer of Care Note  Patient: Benjamin SosJames T Ocampo  Procedure(s) Performed: Procedure(s): BURR HOLES (Bilateral)  Patient Location: ICU  Anesthesia Type:General  Level of Consciousness: awake and confused  Airway & Oxygen Therapy: Patient Spontanous Breathing and Patient connected to nasal cannula oxygen  Post-op Assessment: Report given to RN and Patient moving all extremities X 4  Post vital signs: Reviewed and stable  Last Vitals:  Vitals:   11/15/15 0500 11/15/15 0600  BP: (!) 147/63 (!) 147/68  Pulse: (!) 58 65  Resp: 15 14  Temp:      Last Pain:  Vitals:   11/15/15 0200  TempSrc: Oral  PainSc:          Complications: No apparent anesthesia complications

## 2015-11-15 NOTE — Progress Notes (Signed)
Pt accidentally pulled out SD drain.  Notified Dr. Glee ArvinKram. No new orders.

## 2015-11-15 NOTE — Anesthesia Procedure Notes (Signed)
Procedure Name: Intubation Date/Time: 11/15/2015 7:46 AM Performed by: De NurseENNIE, Jordin Dambrosio E Pre-anesthesia Checklist: Patient identified, Emergency Drugs available, Suction available and Patient being monitored Patient Re-evaluated:Patient Re-evaluated prior to inductionOxygen Delivery Method: Circle System Utilized Preoxygenation: Pre-oxygenation with 100% oxygen Intubation Type: IV induction Ventilation: Mask ventilation without difficulty Laryngoscope Size: Mac and 3 Tube type: Subglottic suction tube Tube size: 8.0 mm Number of attempts: 1 Airway Equipment and Method: Stylet and Oral airway Placement Confirmation: ETT inserted through vocal cords under direct vision,  positive ETCO2 and breath sounds checked- equal and bilateral Secured at: 22 cm Tube secured with: Tape Dental Injury: Teeth and Oropharynx as per pre-operative assessment

## 2015-11-15 NOTE — H&P (Signed)
Benjamin [name] is an 81 y.o. male.   Chief Complaint: Headaches dizziness HPI: The patient is 81 year old gentleman who had a remote trauma to his head back in the may was seen in the ER discharged with normal imaging studies normal head CT. Patient has had a couple of episodes of dizziness. As had headaches over the last week in about 2 weeks and has been evaluated and followed by Dr. Floyde Parkins with neurology. He has had MRI scans back in July that did show some hygromatous fluid collections however he had recent CTs that showed an extension and he had one today that showed a worsening of subacute on chronic subdural hematoma patient was called and told to come the emergency room. Currently the patient says he is doing fairly well he has very slight headaches no more episodes of dizziness he's had some numbness in the tips of his left hand fingers. In his report some unsteadiness with his gait. He feels fine when he sitting down or lying down.  Past Medical History:  Diagnosis Date  . High cholesterol   . Hypertension   . Post concussion syndrome 11/07/2015  . Subdural hematoma ([place]) 11/07/2015    Past Surgical History:  Procedure Laterality Date  . BRAIN SURGERY  1949  . CATARACT EXTRACTION  2013-14  . KIDNEY STONE SURGERY  2000  . TONSILLECTOMY  1944  . VEIN LIGATION  2016    Family History  Problem Relation Age of Onset  . High blood pressure Mother   . High Cholesterol Mother    Social History:  reports that he has never smoked. He has never used smokeless tobacco. He reports that he does not drink alcohol or use drugs.  Allergies: Not on File   (Not in a hospital admission)  Results for orders placed or performed during the hospital encounter of 11/14/15 (from the past 48 hour(s))  CBC with Differential     Status: None   Collection Time: 11/14/15  8:15 PM  Result Value Ref Range   WBC 8.2 4.0 - 10.5 K/uL   RBC 5.22 4.22 - 5.81 MIL/uL   Hemoglobin 15.4 13.0 - 17.0 g/dL    HCT 44.5 39.0 - 52.0 %   MCV 85.2 78.0 - 100.0 fL   MCH 29.5 26.0 - 34.0 pg   MCHC 34.6 30.0 - 36.0 g/dL   RDW 13.9 11.5 - 15.5 %   Platelets 266 150 - 400 K/uL   Neutrophils Relative % 70 %   Neutro Abs 5.7 1.7 - 7.7 K/uL   Lymphocytes Relative 23 %   Lymphs Abs 1.9 0.7 - 4.0 K/uL   Monocytes Relative 6 %   Monocytes Absolute 0.5 0.1 - 1.0 K/uL   Eosinophils Relative 1 %   Eosinophils Absolute 0.1 0.0 - 0.7 K/uL   Basophils Relative 0 %   Basophils Absolute 0.0 0.0 - 0.1 K/uL  Comprehensive metabolic panel     Status: Abnormal   Collection Time: 11/14/15  8:15 PM  Result Value Ref Range   Sodium 138 135 - 145 mmol/L   Potassium 4.2 3.5 - 5.1 mmol/L   Chloride 105 101 - 111 mmol/L   CO2 25 22 - 32 mmol/L   Glucose, Bld 85 65 - 99 mg/dL   BUN 16 6 - 20 mg/dL   Creatinine, Ser 1.08 0.61 - 1.24 mg/dL   Calcium 9.4 8.9 - 10.3 mg/dL   Total Protein 6.4 (L) 6.5 - 8.1 g/dL   Albumin 3.8  3.5 - 5.0 g/dL   AST 23 15 - 41 U/L   ALT 19 17 - 63 U/L   Alkaline Phosphatase 70 38 - 126 U/L   Total Bilirubin 1.0 0.3 - 1.2 mg/dL   GFR calc non Af Amer >60 >60 mL/min   GFR calc Af Amer >60 >60 mL/min    Comment: (NOTE) The eGFR has been calculated using the CKD EPI equation. This calculation has not been validated in all clinical situations. eGFR's persistently <60 mL/min signify possible Chronic Kidney Disease.    Anion gap 8 5 - 15  Protime-INR     Status: None   Collection Time: 11/14/15  8:15 PM  Result Value Ref Range   Prothrombin Time 12.8 11.4 - 15.2 seconds   INR 0.96    Ct Head Wo Contrast  Result Date: 11/14/2015 CLINICAL DATA:  Frequent falls for 1 month. History of post concussion syndrome and subdural hematoma. EXAM: CT HEAD WITHOUT CONTRAST TECHNIQUE: Contiguous axial images were obtained from the base of the skull through the vertex without intravenous contrast. COMPARISON:  None. FINDINGS: BRAIN: Bilateral holo hemispheric heterogeneously dense subdural hematomas  measuring up to 16 mm on the RIGHT, 15 mm on the LEFT. Linear possible membrane formation bilaterally. Subjacent sulcal effacement. No midline shift. No intraparenchymal hemorrhage or acute large vascular territory infarcts. Basal cisterns are patent. RIGHT cerebellar encephalomalacia. VASCULAR: Moderate calcific atherosclerosis of the carotid siphons. SKULL: No skull fracture. No significant scalp soft tissue swelling. SINUSES/ORBITS: The included ocular globes and orbital contents are non-suspicious. Status post bilateral ocular lens implants. The mastoid air-cells and included paranasal sinuses are well-aerated. OTHER: None. IMPRESSION: Acute on chronic large bilateral holo hemispheric subdural hematomas with similar mass effect, no midline shift. 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 Acute findings discussed with and reconfirmed by Dr.KATHLEEN MCMANUS on 11/14/2015 at 10:45 pm. Electronically Signed   By: Elon Alas M.D.   On: 11/14/2015 22:49    Review of Systems  Constitutional: Negative.   Eyes: Negative.   Respiratory: Negative.   Cardiovascular: Negative.   Gastrointestinal: Negative.   Genitourinary: Negative.   Musculoskeletal: Negative.   Skin: Negative.   Neurological: Positive for dizziness and headaches.  Psychiatric/Behavioral: Negative.     Blood pressure 143/73, pulse (!) 57, temperature 98.5 F (36.9 C), resp. rate 19, height '5\' 9"'$  (1.753 m), weight 75.5 kg (166 lb 8 oz), SpO2 94 %. Physical Exam  Constitutional: He is oriented to person, place, and time. He appears well-developed and well-nourished.  HENT:  Head: Normocephalic.  Eyes: Pupils are equal, round, and reactive to light.  Neck: Normal range of motion.  Respiratory: Effort normal.  GI: Soft. Bowel sounds are normal.  Neurological: He is alert and oriented to person, place, and time. He has normal strength. GCS eye subscore is 4. GCS verbal subscore is 5. GCS motor subscore is 6.   Patient is awake and alert pupils equal moves intact cranial nerves are intact strength is 5 out of 5 upper and lower extremities no pronator drift     Assessment/Plan 81 year old gentleman with bilateral subacute subdural hematomas worse in the left we'll admit the patient to the ICU for observation will plan burr hole evacuation of subdurals in the morning.  Jakiyah Stepney P, MD 11/15/2015, 12:01 AM

## 2015-11-16 ENCOUNTER — Inpatient Hospital Stay (HOSPITAL_COMMUNITY): Payer: Medicare Other

## 2015-11-16 MED ORDER — FAMOTIDINE 20 MG PO TABS
20.0000 mg | ORAL_TABLET | Freq: Two times a day (BID) | ORAL | Status: DC
  Administered 2015-11-16 – 2015-11-19 (×6): 20 mg via ORAL
  Filled 2015-11-16 (×6): qty 1

## 2015-11-16 MED ORDER — LEVETIRACETAM 500 MG PO TABS
500.0000 mg | ORAL_TABLET | Freq: Two times a day (BID) | ORAL | Status: DC
  Administered 2015-11-16 – 2015-11-19 (×6): 500 mg via ORAL
  Filled 2015-11-16 (×6): qty 1

## 2015-11-16 NOTE — Progress Notes (Signed)
Patient ID: Benjamin [name], male   DOB: 1934/08/01, 81 y.o.   MRN: [id number] Patient doing well no headache  Awake alert oriented strength 5 out of 5 no pronator drift incisions clean dry and intact  Transfer the floor mobilize with physical occupational therapy.

## 2015-11-16 NOTE — Progress Notes (Signed)
Report called to 72M RN. Patients son updated on new location.

## 2015-11-17 ENCOUNTER — Encounter (HOSPITAL_COMMUNITY): Payer: Self-pay | Admitting: Neurosurgery

## 2015-11-17 NOTE — Progress Notes (Signed)
Subjective: Patient reports Doing well no headache  Objective: Vital signs in last 24 hours: Temp:  [97.6 F (36.4 C)-99.4 F (37.4 C)] (P) 97.9 F (36.6 C) (09/11 0958) Pulse Rate:  [57-75] (P) 75 (09/11 0958) Resp:  [18-20] (P) 18 (09/11 0958) BP: (131-160)/(58-94) (P) 141/54 (09/11 0958) SpO2:  [94 %-98 %] (P) 96 % (09/11 0958)  Intake/Output from previous day: 09/10 0701 - 09/11 0700 In: 225 [I.V.:225] Out: 1500 [Urine:1500] Intake/Output this shift: No intake/output data recorded.  Awake alert no pronator drift  Lab Results:  Recent Labs  11/14/15 2015 11/15/15 0422  WBC 8.2 8.2  HGB 15.4 15.1  HCT 44.5 44.1  PLT 266 245   BMET  Recent Labs  11/14/15 2015  NA 138  K 4.2  CL 105  CO2 25  GLUCOSE 85  BUN 16  CREATININE 1.08  CALCIUM 9.4    Studies/Results: Ct Head Wo Contrast  Result Date: 11/16/2015 CLINICAL DATA:  Follow-up exam for subdural hematoma. Status post evacuation. EXAM: CT HEAD WITHOUT CONTRAST TECHNIQUE: Contiguous axial images were obtained from the base of the skull through the vertex without intravenous contrast. COMPARISON:  Prior CT from 11/14/2015. FINDINGS: Postoperative changes from interval bifrontal burr hole placement for evacuation of subacute on chronic subdural hematoma seen. Skin staples overlie the burr hole site at the frontal scalp. Scattered soft tissue emphysema within the scalp as well. No subdural drain in place. Postoperative pneumocephalus within the extra-axial spaces bilaterally. Mixed density subdural collections again seen. Collection on the left measures up to 12 mm at the left cerebral convexity. Collection on the right measures up to 15 mm at the right frontal convexity, and 18 mm at the right parietal convexity. Overall, these collections are decreased in size relative to prior. For acute blood products seen within the left subdural collection, similar to prior. No definite evidence for new hemorrhage. Persistent  mass effect on the subjacent cerebral hemispheres without midline shift. Basilar cisterns remain patent. Atrophy with chronic microvascular ischemic disease again noted. Remote right cerebellar infarct. No evidence for acute infarct. No mass lesion identified. No hydrocephalus. Globes and orbits within normal limits. Patient is status post lens extraction bilaterally. Chronic mucosal thickening with postsurgical changes noted at the right maxillary sinus. Scattered mucoperiosteal thickening also noted within the ethmoidal air cells. No mastoid effusion. IMPRESSION: 1. Postoperative changes from interval bifrontal burr hole craniotomy for evacuation of bilateral subdural hematomas. Overall size of these hematomas is decreased from prior. Persistent but improved mass effect on the subjacent cerebral hemispheres without midline shift. 2. No other new acute intracranial process. Electronically Signed   By: Rise MuBenjamin  McClintock M.D.   On: 11/16/2015 07:11    Assessment/Plan: Mobilize with physical therapy  LOS: 2 days     Benjamin [name] P 11/17/2015, 11:56 AM

## 2015-11-17 NOTE — Evaluation (Signed)
Physical Therapy Evaluation Patient Details Name: Benjamin [name] MRN: [id number] DOB: [birth year] Today's Date: 11/17/2015   History of Present Illness  pt is a 81 y/o male with h/o HTN, post concussion syndrome, admitted with dizziness and dull headaches due to worsening bil SDH's found through series of CT scans since remote trauma to pt's head back in May.  [facility]  Clinical Impression  Pt admitted with/for dizziness and dull headaches which were found to be due to bil SDH, [facility]  Pt currently limited functionally due to the problems listed below.  (see problems list.)  Pt will benefit from PT to maximize function and safety to be able to get home safely with available assist of wife and son.     Follow Up Recommendations Outpatient PT;Supervision for mobility/OOB    Equipment Recommendations  None recommended by PT;Other (comment) (will let therapies at OPPT decide)    Recommendations for Other Services       Precautions / Restrictions Precautions Precautions: Fall      Mobility  Bed Mobility Overal bed mobility: Needs Assistance Bed Mobility: Supine to Sit;Sit to Supine     Supine to sit: Supervision Sit to supine: Supervision      Transfers Overall transfer level: Needs assistance   Transfers: Sit to/from Stand;Stand Pivot Transfers Sit to Stand: Min assist;Min guard (initially min stability assist better after treatment)            Ambulation/Gait Ambulation/Gait assistance: Min guard Ambulation Distance (Feet): 450 Feet Assistive device: None Gait Pattern/deviations: Step-through pattern Gait velocity: functional Gait velocity interpretation: at or above normal speed for age/gender General Gait Details: initially, mildly unsteady and guarded then improved after completing a battery of balance tests.  Stairs Stairs: Yes   Stair Management: Alternating pattern;One rail Right;Forwards Number of Stairs: 4 General  stair comments: safe with rail  Wheelchair Mobility    Modified Rankin (Stroke Patients Only) Modified Rankin (Stroke Patients Only) Pre-Morbid Rankin Score: No significant disability Modified Rankin: Moderate disability     Balance Overall balance assessment: Needs assistance Sitting-balance support: No upper extremity supported Sitting balance-Leahy Scale: Fair     Standing balance support: No upper extremity supported Standing balance-Leahy Scale: Fair Standing balance comment: Able to stand statically on firm surface, but not foam surface without losing balance posteriorly.                 Standardized Balance Assessment Standardized Balance Assessment : Dynamic Gait Index   Dynamic Gait Index Level Surface: Mild Impairment Change in Gait Speed: Normal Gait with Horizontal Head Turns: Moderate Impairment Gait with Vertical Head Turns: Normal Gait and Pivot Turn: Mild Impairment Step Over Obstacle: Mild Impairment Step Around Obstacles: Mild Impairment Steps: Mild Impairment Total Score: 17       Pertinent Vitals/Pain Pain Assessment: Faces Faces Pain Scale: No hurt    Home Living Family/patient expects to be discharged to:: Private residence Living Arrangements: Spouse/significant other Available Help at Discharge: Family;Available 24 hours/day Type of Home: House         Home Equipment: None      Prior Function Level of Independence: Independent               Hand Dominance        Extremity/Trunk Assessment   Upper Extremity Assessment: Overall WFL for tasks assessed           Lower Extremity Assessment: Overall WFL for tasks assessed  Communication   Communication: No difficulties  Cognition Arousal/Alertness: Awake/alert Behavior During Therapy: WFL for tasks assessed/performed Overall Cognitive Status: Impaired/Different from baseline       Memory: Decreased short-term memory              General  Comments General comments (skin integrity, edema, etc.): pt improved on some of he DGI scores as treatment progressed, but still falls into range of fall risk potential.    Exercises        Assessment/Plan    PT Assessment Patient needs continued PT services  PT Diagnosis Abnormality of gait   PT Problem List Decreased balance;Decreased mobility;Decreased cognition;Decreased safety awareness  PT Treatment Interventions Gait training;Stair training;Functional mobility training;Therapeutic activities;Balance training;Patient/family education   PT Goals (Current goals can be found in the Care Plan section) Acute Rehab PT Goals Patient Stated Goal: back independent PT Goal Formulation: With patient Time For Goal Achievement: 12/01/15 Potential to Achieve Goals: Good    Frequency Min 3X/week   Barriers to discharge        Co-evaluation               End of Session   Activity Tolerance: Patient tolerated treatment well Patient left: in bed;with call bell/phone within reach;with family/visitor present Nurse Communication: Mobility status         Time: 1610-9604 PT Time Calculation (min) (ACUTE ONLY): 34 min   Charges:   PT Evaluation $PT Eval Moderate Complexity: 1 Procedure PT Treatments $Gait Training: 8-22 mins   PT G Codes:        Benjamin [name], Benjamin [name] 11/17/2015, 5:07 PM 11/17/2015   Bing, PT [phone]  (pager)

## 2015-11-18 NOTE — Care Management Important Message (Signed)
Important Message  Patient Details  Name: Benjamin [name] MRN: [id number] Date of Birth: May 14, 1934   Medicare Important Message Given:  Yes    Ticia Virgo Stefan ChurchBratton 11/18/2015, 11:46 AM

## 2015-11-18 NOTE — Progress Notes (Signed)
Physical Therapy Treatment Patient Details Name: Benjamin [name] MRN: [id number] DOB: 1934-09-29 Today's Date: 11/18/2015    History of Present Illness pt is a 81 y/o male with h/o HTN, post concussion syndrome, admitted with dizziness and dull headaches due to worsening bil SDH's found through series of CT scans since remote trauma to pt's head back in May.    PT Comments    Progressing well.  DGI and overall balance and mobility safety has increased.  Should be safe for pt and wife to manage and get to OPPT for further rehab.   Follow Up Recommendations  Outpatient PT;Supervision for mobility/OOB     Equipment Recommendations  None recommended by PT;Other (comment)    Recommendations for Other Services       Precautions / Restrictions Precautions Precautions: Fall Restrictions Weight Bearing Restrictions: No    Mobility  Bed Mobility Overal bed mobility: Needs Assistance Bed Mobility: Supine to Sit     Supine to sit: Supervision     General bed mobility comments: supervision only because pt not making independent level decisions for safety  Transfers Overall transfer level: Needs assistance   Transfers: Sit to/from Stand Sit to Stand: Min guard;Supervision         General transfer comment: better today.  Stood up on the foam and maintained stand immediately without support of calves on the bed frame.   Ambulation/Gait Ambulation/Gait assistance: Supervision Ambulation Distance (Feet): 500 Feet Assistive device: None Gait Pattern/deviations: Step-through pattern Gait velocity: functional Gait velocity interpretation: at or above normal speed for age/gender General Gait Details: episodes of mild unsteadiness, but started better and ended better.   Stairs Stairs: Yes Stairs assistance: Supervision Stair Management: One rail Right;Alternating pattern;Forwards Number of Stairs: 12 General stair comments: safe with rail  Wheelchair Mobility     Modified Rankin (Stroke Patients Only) Modified Rankin (Stroke Patients Only) Pre-Morbid Rankin Score: No significant disability Modified Rankin: Moderate disability     Balance Overall balance assessment: Needs assistance   Sitting balance-Leahy Scale: Good     Standing balance support: No upper extremity supported Standing balance-Leahy Scale: Fair Standing balance comment: able to stand upright without LOB immediately on standing up from the bed onto the foam mat.  But was still guarded with dynamic tasks like turning 360*                    Cognition Arousal/Alertness: Awake/alert Behavior During Therapy: WFL for tasks assessed/performed Overall Cognitive Status: Impaired/Different from baseline Area of Impairment: Safety/judgement;Awareness;Problem solving     Memory: Decreased short-term memory   Safety/Judgement: Decreased awareness of safety;Decreased awareness of deficits Awareness: Emergent Problem Solving: Slow processing      Exercises      General Comments General comments (skin integrity, edema, etc.): Improved score to just over the threshold for fall risk.      Pertinent Vitals/Pain Pain Assessment: No/denies pain    Home Living                      Prior Function            PT Goals (current goals can now be found in the care plan section) Acute Rehab PT Goals Patient Stated Goal: back independent PT Goal Formulation: With patient Time For Goal Achievement: 12/01/15 Potential to Achieve Goals: Good Progress towards PT goals: Progressing toward goals    Frequency  Min 3X/week    PT Plan Current plan remains appropriate  Co-evaluation             End of Session   Activity Tolerance: Patient tolerated treatment well Patient left: with call bell/phone within reach;in chair;with chair alarm set     Time: 1425-1447 PT Time Calculation (min) (ACUTE ONLY): 22 min  Charges:  $Gait Training: 8-22 mins                     G Codes:      Muriel Hannold, Eliseo Gum 11/18/2015, 2:59 PM 11/18/2015  Wilson Bing, PT [phone]  (pager)

## 2015-11-18 NOTE — Progress Notes (Signed)
Patient ID: Benjamin [name], male   DOB: [birth year], 81 y.o.   MRN: [id number] Patient doing well minimal headache awake alert oriented strength 5 out of 5 wound is clean dry and intact  Will see how he does with physical therapy today and discharge when cleared from physical therapy and family feels comfortable with discharge.

## 2015-11-18 NOTE — Care Management Note (Signed)
Case Management Note  Patient Details  Name: Benjamin [name] MRN: [id number] Date of Birth: September 15, 1934  Subjective/Objective:    Pt admitted with SDH. S/P burr holes. He is from home with his spouse.                 Action/Plan: PT recommending outpatient therapy. CM following for d/c needs.   Expected Discharge Date:                  Expected Discharge Plan:  Home/Self Care  In-House Referral:     Discharge planning Services     Post Acute Care Choice:    Choice offered to:     DME Arranged:    DME Agency:     HH Arranged:    HH Agency:     Status of Service:  In process, will continue to follow  If discussed at Long Length of Stay Meetings, dates discussed:    Additional Comments:  Kermit BaloKelli F Chimaobi Casebolt, RN 11/18/2015, 3:24 PM

## 2015-11-19 NOTE — Progress Notes (Signed)
Patient ID: Benjamin [name], male   DOB: 1934/12/20, 81 y.o.   MRN: [id number] Patient doing well no headache  Neurologically stable no focal deficits still some slight confusion  Plan discharge later today arrange home health physical therapy and home health aide.

## 2015-11-19 NOTE — Discharge Summary (Signed)
Physician Discharge Summary  Patient ID: Benjamin [name] MRN: [id number] DOB/AGE: May 31, 1934 81 y.o.  Admit date: 11/14/2015 Discharge date: 11/19/2015  Admission Diagnoses:Subdural hematoma  Discharge Diagnoses: Same Active Problems:   Subdural hematoma (HCC)   SDH (subdural hematoma) [facility])   Discharged Condition: good  Hospital Course: Patient was admitted to the emergency department with confusion and altered mental status dizziness workup revealed bilateral subdural hematomas patient was early taken to the OR underwent for hole craniectomy for evacuation of subdural hematomas. Postoperatively patient did fairly well coming floor on the floor was ambulating voiding spontaneously tolerating her diet and stable from physical therapy to discharge home with home health and home health physical therapy. She will have scheduled follow-up in 2 weeks.  Consults: Significant Diagnostic Studies: Treatments: Bilateral bur hole craniectomy for evacuation of subdural hematomas Discharge Exam: Blood pressure 127/62, pulse 77, temperature 97.7 F (36.5 C), temperature source Oral, resp. rate 16, height 5\' 9"  (1.753 m), weight 75.5 kg (166 lb 8 oz), SpO2 97 %. Awake alert oriented strength 5 out of 5  Disposition: Home  Discharge Instructions    Face-to-face encounter (required for Medicare/Medicaid patients)    Complete by:  As directed    I Chima Astorino P certify that this patient is under my care and that I, or a nurse practitioner or physician's assistant working with me, had a face-to-face encounter that meets the physician face-to-face encounter requirements with this patient on 11/19/2015. The encounter with the patient was in whole, or in part for the following medical condition(s) which is the primary reason for home health care (List medical condition): Subdural hematoma   The encounter with the patient was in whole, or in part, for the following medical condition, which is the primary  reason for home health care:  Subdural hematoma   I certify that, based on my findings, the following services are medically necessary home health services:   Nursing Physical therapy     Reason for Medically Necessary Home Health Services:  Therapy- Investment banker, operational, Patent examiner   My clinical findings support the need for the above services:  Unable to leave home safely without assistance and/or assistive device   Further, I certify that my clinical findings support that this patient is homebound due to:  Unable to leave home safely without assistance   Home Health    Complete by:  As directed    To provide the following care/treatments:   PT OT Home Health Aide         Medication List    TAKE these medications   amLODipine 2.5 MG tablet Commonly known as:  NORVASC Take 2.5 mg by mouth every morning.   cefUROXime 250 MG tablet Commonly known as:  CEFTIN Take 250 mg by mouth 2 (two) times daily. For 10 days ending 11-14-15   lisinopril 20 MG tablet Commonly known as:  PRINIVIL,ZESTRIL Take 20 mg by mouth at bedtime.   meclizine 12.5 MG tablet Commonly known as:  ANTIVERT Take 12.5 mg by mouth 2 (two) times daily as needed for dizziness.   rosuvastatin 20 MG tablet Commonly known as:  CRESTOR Take 20 mg by mouth at bedtime.   saccharomyces boulardii 250 MG capsule Commonly known as:  FLORASTOR Take 250 mg by mouth 2 (two) times daily. For 10 days ending 11-14-15      Follow-up Information    Deedra Pro P, MD Follow up in 2 day(s).   Specialty:  Neurosurgery Contact information: [address] [place] [address] [phone]        Mariam DollarRAM,Zelia Yzaguirre P, MD .   Specialty:  Neurosurgery Contact information: [address] [place] [address] [phone]           Signed: Mariam DollarCRAM, [place] P 11/19/2015, 4:37 PM

## 2015-11-19 NOTE — Care Management Note (Addendum)
Case Management Note  Patient Details  Name: Benjamin [name] MRN: [id number] Date of Birth: 19-Apr-1934  Subjective/Objective:                    Action/Plan: Patient discharging home with orders for [facility] services. CM met with the patient and his family and provided a list of [place] agencies in the [place] area. They selected [facility]. CM spoke with [facility] and they accepted the referral but do not have OT and will not be able to see him for PT until Monday. CM spoke with the patients wife and she is ok with both of the above. She does not want to use another agency. Information faxed to [facility] at the number provided. CM also called Dr Saintclair Halsted and informed him of the above.  Bedside RN updated.   Expected Discharge Date:                  Expected Discharge Plan:  [place]  In-House Referral:     Discharge planning Services  CM Consult  Post Acute Care Choice:    Choice offered to:  Spouse  DME Arranged:    DME Agency:     HH Arranged:  PT, OT, Nurse's Aide [place] Agency:  [facility]  Status of Service:  Completed, signed off  If discussed at [place] of Stay Meetings, dates discussed:    Additional Comments:  Pollie Friar, RN 11/19/2015, 4:59 PM

## 2015-11-19 NOTE — Discharge Instructions (Signed)
No lifting no bending no twisting or driving or riding a car unless he is going back and forth to see me. Keep incision clean dry and intact.

## 2015-11-19 NOTE — Progress Notes (Signed)
Discharge instructions given. Pt verbalized understanding and all questions were answered.  

## 2015-11-24 ENCOUNTER — Telehealth: Payer: Self-pay | Admitting: Neurology

## 2015-11-24 NOTE — Telephone Encounter (Signed)
Called and spoke to patients wife. Per Dr Lucia GaskinsAhern, she called to see how patient was doing post-surgery. Any questions about surgery should go to Dr Anne HahnWillis.  She stated she didn't have any specific questions about the surgery. She just wanted to thank Dr Lucia GaskinsAhern. She stated he is doing "fabulous" and that they received "great care" at [facility]. She stated their surgeon was wonderful. She stated the patient has minimal confusion but it is clearing up. He got home Wednesday evening around 6 pm. He is now walking around the house and going up and down the stairs. In home therapy is coming today to evaluate him. Patient wife would like a call from Dr Lucia GaskinsAhern just to thank her personally. Advised I will send message to her to call. She verbalized understanding.

## 2015-11-24 NOTE — Telephone Encounter (Signed)
Patient wife is calling to discuss the patient's surgery and is also returning a call to Dr. Lucia GaskinsAhern.

## 2015-11-24 NOTE — Telephone Encounter (Signed)
Discussed with wife I am very appreciative. thanks

## 2015-11-28 ENCOUNTER — Other Ambulatory Visit: Payer: Self-pay | Admitting: Neurosurgery

## 2015-11-28 DIAGNOSIS — S065XAA Traumatic subdural hemorrhage with loss of consciousness status unknown, initial encounter: Secondary | ICD-10-CM

## 2015-11-28 DIAGNOSIS — S065X9A Traumatic subdural hemorrhage with loss of consciousness of unspecified duration, initial encounter: Secondary | ICD-10-CM

## 2015-12-19 ENCOUNTER — Ambulatory Visit
Admission: RE | Admit: 2015-12-19 | Discharge: 2015-12-19 | Disposition: A | Discharge status: Home / Self Care | Payer: Medicare Other | Source: Ambulatory Visit | Attending: Neurosurgery | Admitting: Neurosurgery

## 2015-12-19 DIAGNOSIS — S065X9A Traumatic subdural hemorrhage with loss of consciousness of unspecified duration, initial encounter: Secondary | ICD-10-CM

## 2015-12-19 DIAGNOSIS — S065XAA Traumatic subdural hemorrhage with loss of consciousness status unknown, initial encounter: Secondary | ICD-10-CM

## 2016-01-05 ENCOUNTER — Ambulatory Visit: Payer: Medicare Other | Admitting: Neurology

## 2016-01-28 ENCOUNTER — Ambulatory Visit (INDEPENDENT_AMBULATORY_CARE_PROVIDER_SITE_OTHER): Payer: Medicare Other | Admitting: Neurology

## 2016-01-28 ENCOUNTER — Encounter: Payer: Self-pay | Admitting: Neurology

## 2016-01-28 VITALS — BP 156/78 | HR 64 | Ht 69.0 in | Wt 168.0 lb

## 2016-01-28 DIAGNOSIS — F0781 Postconcussional syndrome: Secondary | ICD-10-CM | POA: Diagnosis not present

## 2016-01-28 DIAGNOSIS — S065X9A Traumatic subdural hemorrhage with loss of consciousness of unspecified duration, initial encounter: Secondary | ICD-10-CM

## 2016-01-28 DIAGNOSIS — I6200 Nontraumatic subdural hemorrhage, unspecified: Secondary | ICD-10-CM

## 2016-01-28 DIAGNOSIS — S065XAA Traumatic subdural hemorrhage with loss of consciousness status unknown, initial encounter: Secondary | ICD-10-CM

## 2016-01-28 NOTE — Progress Notes (Signed)
Reason for visit: Subdural hematoma  Benjamin [name] is an 81 y.o. male  History of present illness:  Mr. Benjamin [name] is a 81 year old left-handed white male with a history of a fall that occurred on 07/26/2015. The patient developed a subdural hematoma, he required surgery around 11/14/2015. This was done by Dr. Wynetta Emeryram. A follow-up study done on 12/19/2015 showed good improvement with the subdural hematoma. The patient has done well since that time. He has had some occasional dizziness, he reports no further headaches, he has not had any numbness or weakness of the face, arms, or legs. He denies any further falls. He returns to this office for an evaluation. The patient reports some occasional numbness of the hands, this is not a persistent issue.   Past Medical History:  Diagnosis Date  . High cholesterol   . Hypertension   . Post concussion syndrome 11/07/2015  . Subdural hematoma (HCC) 11/07/2015    Past Surgical History:  Procedure Laterality Date  . BRAIN SURGERY  1949  . BURR HOLE Bilateral 11/15/2015   Procedure: Ezekiel InaBURR HOLES;  Surgeon: Donalee CitrinGary Cram, MD;  Location: [facility];  Service: Neurosurgery;  Laterality: Bilateral;  . CATARACT EXTRACTION  2013-14  . KIDNEY STONE SURGERY  2000  . TONSILLECTOMY  1944  . VEIN LIGATION  2016    Family History  Problem Relation Age of Onset  . High blood pressure Mother   . High Cholesterol Mother     Social history:  reports that he has never smoked. He has never used smokeless tobacco. He reports that he does not drink alcohol or use drugs.   No Known Allergies  Medications:  Prior to Admission medications   Medication Sig Start Date End Date Taking? Authorizing Provider  amLODipine (NORVASC) 2.5 MG tablet Take 2.5 mg by mouth every morning.  08/12/15   Historical Provider, MD  lisinopril (PRINIVIL,ZESTRIL) 20 MG tablet Take 20 mg by mouth at bedtime.     Historical Provider, MD  meclizine (ANTIVERT) 12.5 MG tablet Take 12.5 mg by mouth 2  (two) times daily as needed for dizziness.  09/29/15   Historical Provider, MD  rosuvastatin (CRESTOR) 20 MG tablet Take 20 mg by mouth at bedtime.  09/22/15   Historical Provider, MD    ROS:  Out of a complete 14 system review of symptoms, the patient complains only of the following symptoms, and all other reviewed systems are negative.  Dizziness  Blood pressure (!) 156/78, pulse 64, height 5\' 9"  (1.753 m), weight 168 lb (76.2 kg).  Physical Exam  General: The patient is alert and cooperative at the time of the examination.  Skin: No significant peripheral edema is noted.   Neurologic Exam  Mental status: The patient is alert and oriented x 3 at the time of the examination. The patient has apparent normal recent and remote memory, with an apparently normal attention span and concentration ability.   Cranial nerves: Facial symmetry is present. Speech is normal, no aphasia or dysarthria is noted. Extraocular movements are full. Visual fields are full.  Motor: The patient has good strength in all 4 extremities.  Sensory examination: Soft touch sensation is symmetric on the face, arms, and legs.  Coordination: The patient has good finger-nose-finger and heel-to-shin bilaterally.  Gait and station: The patient has a normal gait. Tandem gait is normal. Romberg is negative. No drift is seen.  Reflexes: Deep tendon reflexes are symmetric.   CT head 12/19/15:  IMPRESSION: Favorable evolutionary changes  with diminishing subdural collections bilaterally. No new bleeding.  * CT scan images were reviewed online. I agree with the written report.    Assessment/Plan:  1. History of fall with subdural hematoma  The patient has done well following surgery. The patient is to contact our office if new symptoms develop, otherwise the patient will follow-up on an as-needed basis.  Marlan Palau. Keith Anastazia Creek MD 01/28/2016 12:33 PM  [facility] [address] [place], [place] [address]  Phone [phone] Fax [phone]

## 2016-11-29 ENCOUNTER — Encounter (INDEPENDENT_AMBULATORY_CARE_PROVIDER_SITE_OTHER): Payer: Self-pay

## 2016-11-29 ENCOUNTER — Ambulatory Visit (INDEPENDENT_AMBULATORY_CARE_PROVIDER_SITE_OTHER): Payer: Medicare Other | Admitting: Neurology

## 2016-11-29 ENCOUNTER — Encounter: Payer: Self-pay | Admitting: Neurology

## 2016-11-29 VITALS — BP 167/69 | HR 62 | Ht 69.0 in | Wt 163.0 lb

## 2016-11-29 DIAGNOSIS — R55 Syncope and collapse: Secondary | ICD-10-CM | POA: Diagnosis not present

## 2016-11-29 NOTE — Patient Instructions (Signed)
   We will check CTA of the head and neck.   

## 2016-11-29 NOTE — Progress Notes (Signed)
Reason for visit: Near syncope  Referring physician: Dr. Delma Post Benjamin [name] is a 82 y.o. male  History of present illness:  Benjamin [name] is an 82 year old left-handed white male with a history of bilateral subdural hematoma following a fall, requiring surgical decompression in September 2017. The patient has recovered well from that. He was admitted to [facility] on 09/04/2016 with a near-syncopal event. The patient was at home at the time, he suddenly developed a severe cramping pain in the stomach. The patient called for his wife, she went in to see him and he looked pale in the face. The patient then seemed somewhat dazed and then he slumped over, and went to the ground without hitting his head. The patient was never fully unconscious. The patient recovered his mental status very quickly. EMS was called and he was brought to the hospital. The patient had diarrhea shortly after the near syncopal event. The patient reported no numbness or weakness of the face, arms, or legs. He has not had trouble controlling the bowels or the bladder since the event. He does report that he has had some transient dizziness when he stands up from a seated position for several moments, and then the dizziness will go away and he functions normally. He denies any further falls. He has not had chest pain or palpitations of the heart. During the hospitalization, the patient underwent a CT scan of the brain, and EEG study, and a carotid Doppler study as well as a 2-D echocardiogram. These studies were unremarkable. The patient was seen through cardiology and a 48 hour Holter monitor was done and was unremarkable. The patient was seen by Dr. Wynetta Emery from neurosurgery and a MRI of the brain was done, the patient was told that this was unremarkable. The results of this are not available to me. The patient is sent to this office for further evaluation.  Past Medical History:  Diagnosis Date  . High cholesterol    . Hypertension   . Post concussion syndrome 11/07/2015  . Subdural hematoma (HCC) 11/07/2015    Past Surgical History:  Procedure Laterality Date  . BRAIN SURGERY  1949  . BURR HOLE Bilateral 11/15/2015   Procedure: Benjamin [name];  Surgeon: Benjamin Citrin, MD;  Location: [facility];  Service: Neurosurgery;  Laterality: Bilateral;  . CATARACT EXTRACTION  2013-14  . KIDNEY STONE SURGERY  2000  . TONSILLECTOMY  1944  . VEIN LIGATION  2016    Family History  Problem Relation Age of Onset  . High blood pressure Mother   . High Cholesterol Mother     Social history:  reports that he has never smoked. He has never used smokeless tobacco. He reports that he does not drink alcohol or use drugs.  Medications:  Prior to Admission medications   Medication Sig Start Date End Date Taking? Authorizing Provider  amLODipine (NORVASC) 2.5 MG tablet Take 2.5 mg by mouth every morning.  08/12/15  Yes [provider]  lisinopril (PRINIVIL,ZESTRIL) 20 MG tablet Take 20 mg by mouth at bedtime.    Yes [provider]  meclizine (ANTIVERT) 12.5 MG tablet Take 12.5 mg by mouth 2 (two) times daily as needed for dizziness.  09/29/15  Yes [provider]  rosuvastatin (CRESTOR) 20 MG tablet Take 20 mg by mouth at bedtime.  09/22/15  Yes [provider]     No Known Allergies  ROS:  Out of a complete 14 system review of symptoms,  the patient complains only of the following symptoms, and all other reviewed systems are negative.  Weakness in the legs, dizziness  Blood pressure (!) 167/69, pulse 62, height  (1.753 m), weight 163 lb (73.9 kg).   Blood pressure, right arm, sitting is 178/76. Blood pressure, right arm, standing is 172/78.  Physical Exam  General: The patient is alert and cooperative at the time of the examination.  Eyes: Pupils are equal, round, and reactive to light. Discs are flat bilaterally.  Neck: The neck is supple, no carotid bruits are  noted.  Respiratory: The respiratory examination is clear.  Cardiovascular: The cardiovascular examination reveals a regular rate and rhythm, no obvious murmurs or rubs are noted.  Skin: Extremities are without significant edema.  Neurologic Exam  Mental status: The patient is alert and oriented x 3 at the time of the examination. The patient has apparent normal recent and remote memory, with an apparently normal attention span and concentration ability.  Cranial nerves: Facial symmetry is present. There is good sensation of the face to pinprick and soft touch bilaterally. The strength of the facial muscles and the muscles to head turning and shoulder shrug are normal bilaterally. Speech is well enunciated, no aphasia or dysarthria is noted. Extraocular movements are full. Visual fields are full. The tongue is midline, and the patient has symmetric elevation of the soft palate. No obvious hearing deficits are noted.  Motor: The motor testing reveals 5 over 5 strength of all 4 extremities. Good symmetric motor tone is noted throughout.  Sensory: Sensory testing is intact to pinprick, soft touch and vibration sensation on all 4 extremities. No evidence of extinction is noted.  Coordination: Cerebellar testing reveals good finger-nose-finger and heel-to-shin bilaterally.  Gait and station: Gait is normal. Tandem gait is normal. Romberg is negative. No drift is seen.  Reflexes: Deep tendon reflexes are symmetric and normal bilaterally. Toes are downgoing bilaterally.   Assessment/Plan:  1. Near syncope, likely vasovagal syncope  2. Episodic dizziness with standing  3. History of bilateral subdural hematoma  The event of near syncope is most consistent with vasovagal syncope, the event was preceded by abdominal cramping, and the patient had diarrhea after the event. The patient however is now reporting some transient episodes of dizziness that occurs only with standing, and then goes away  once the dizziness passes. This appears to be most consistent with a transient drop in blood pressure, although orthostatic hypotension was not documented today. The patient will undergo a CT angiogram of the head and neck, if this is unremarkable no further workup will be done.  Marlan Palau MD 11/29/2016 9:35 AM  [facility] [address] [place], [place] [address]  Phone [phone] Fax [phone]

## 2016-11-30 ENCOUNTER — Telehealth: Payer: Self-pay | Admitting: Neurology

## 2016-11-30 NOTE — Telephone Encounter (Signed)
I have received the discharge summary from [facility]. The patient was admitted for an event of near-syncope. CT the head and carotid Doppler studies were unremarkable. EEG study was normal. On admission, BUN and creatinine were 20/1.20, and discharged the BUN/creatinine were 11/0.89.    CT of the head showed global atrophy with hydrocephalus ex vacuo, some white matter disease was seen. No acute changes were noted.   I do not see evidence that a 2-D echocardiogram was done.

## 2016-12-01 NOTE — Telephone Encounter (Signed)
Pt wife (on [place]) is asking for a call back confirming the scan that is to be done will be in [place] and not [place] unless it is Dr Clarisa Kindred preferred location where pt goes please call.

## 2016-12-01 NOTE — Telephone Encounter (Signed)
I called the wife. Okay to have the scan done in [place] if that is where they wanted done.

## 2016-12-14 ENCOUNTER — Telehealth: Payer: Self-pay | Admitting: Neurology

## 2016-12-14 NOTE — Telephone Encounter (Signed)
I called patient, talk with the wife. CT angiogram of the head showed no blockages of the anterior or posterior circulation that would have resulted in a syncopal event. The patient likely had vasovagal syncope.  No further workup indicated.

## 2018-01-19 IMAGING — CT CT HEAD W/O CM
3 of 4 series · 17 of 47 positions shown, 20 images · non-contrast
Comparison: None.

CLINICAL DATA: Frequent falls for 1 month. History of post
concussion syndrome and subdural hematoma.

EXAM:
CT HEAD WITHOUT CONTRAST
TECHNIQUE: Contiguous axial images were obtained from the base of the skull
through the vertex without intravenous contrast.

[Series 201: head w/o, idose (1) · axial · non-contrast · 0.42mm/px · z∈[+1351,+1476]mm · 11 of 31 slices shown, 14 images]
[im 3/31  brain]
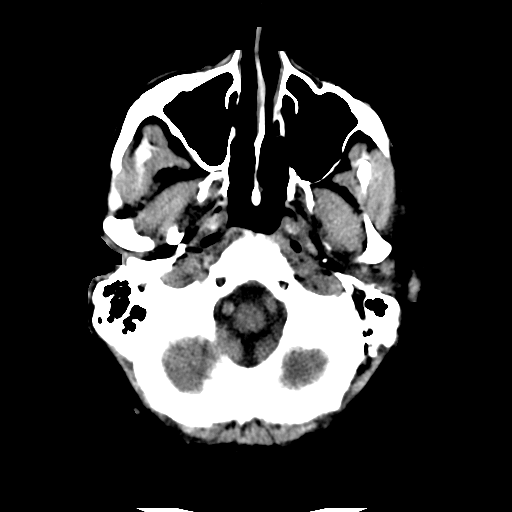
[im 3/31  bone]
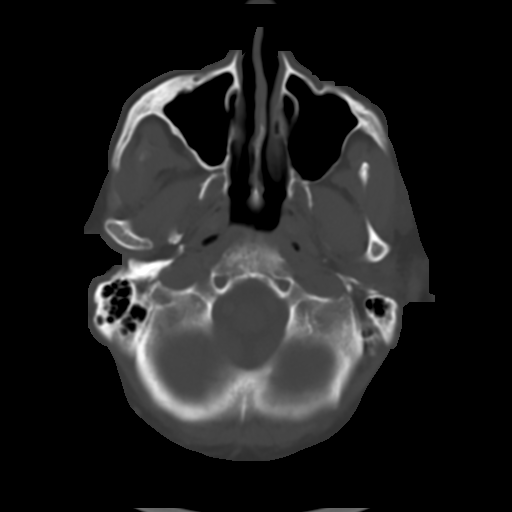
[im 5/31  brain]
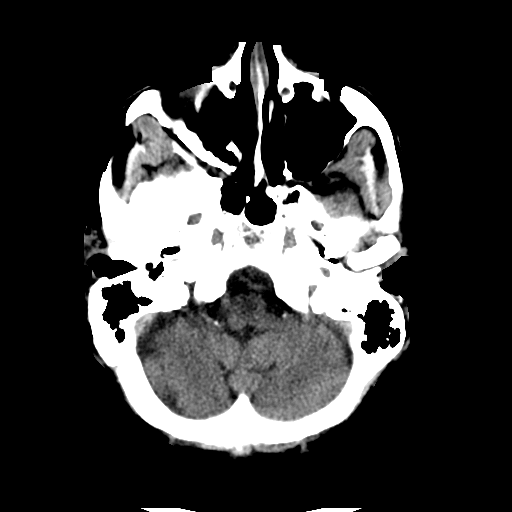
[im 7/31  brain]
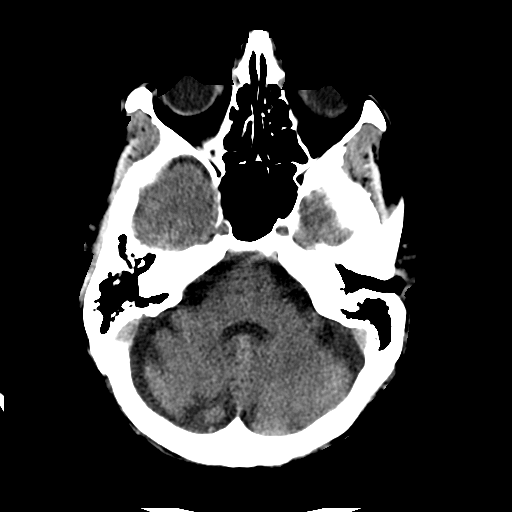
[im 11/31  brain]
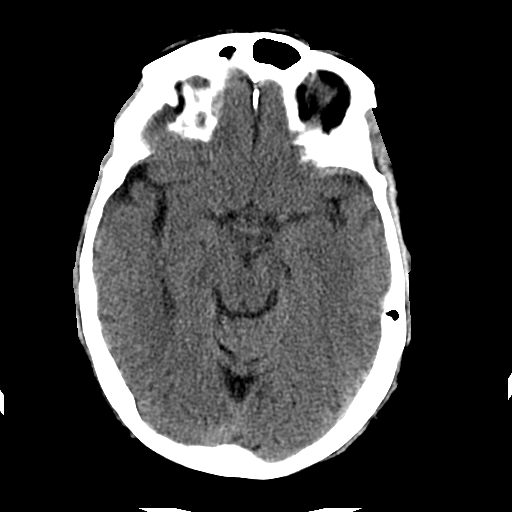
[im 13/31  brain]
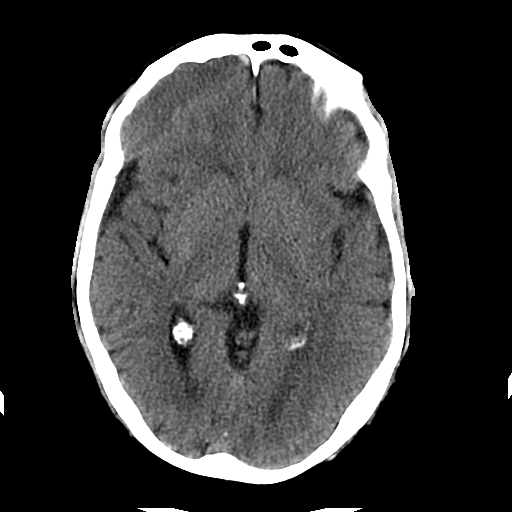
[im 13/31  bone]
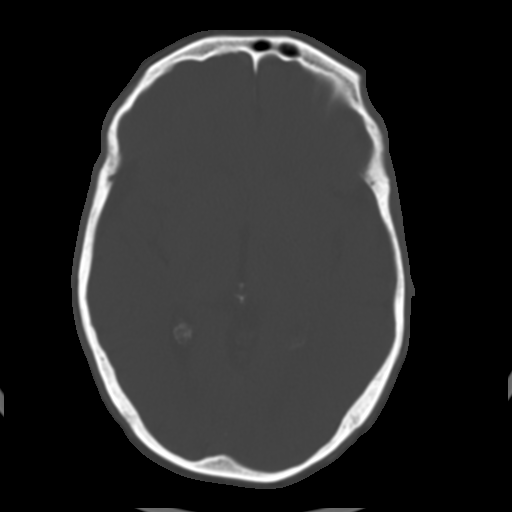
[im 16/31  brain]
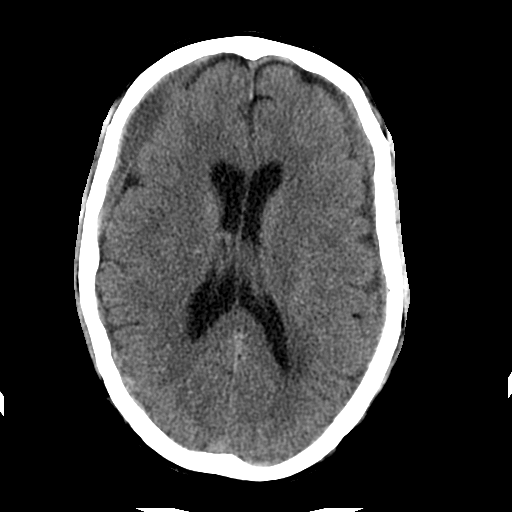
[im 18/31  brain]
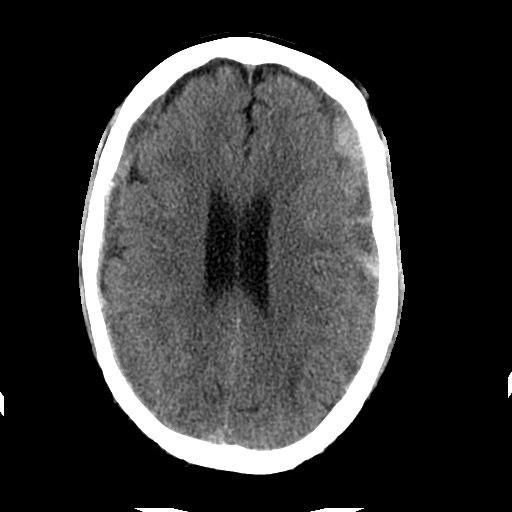
[im 20/31  brain]
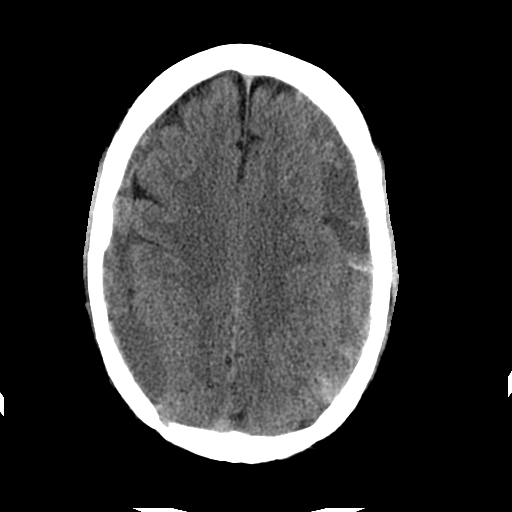
[im 24/31  brain]
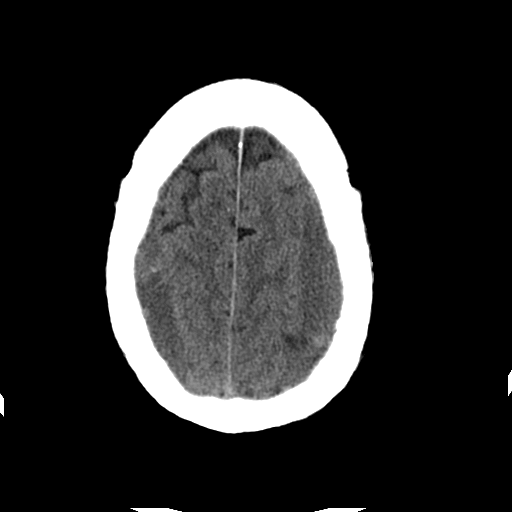
[im 24/31  bone]
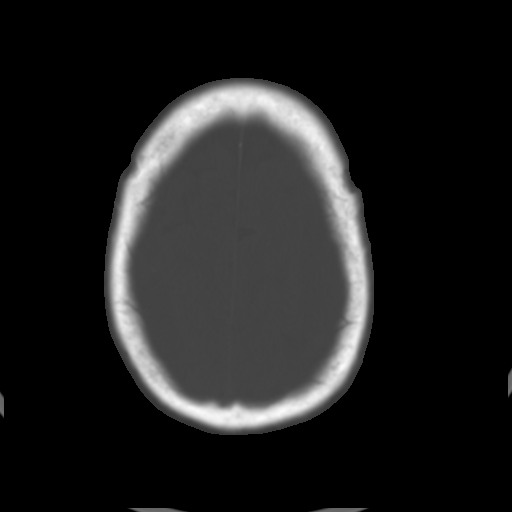
[im 26/31  brain]
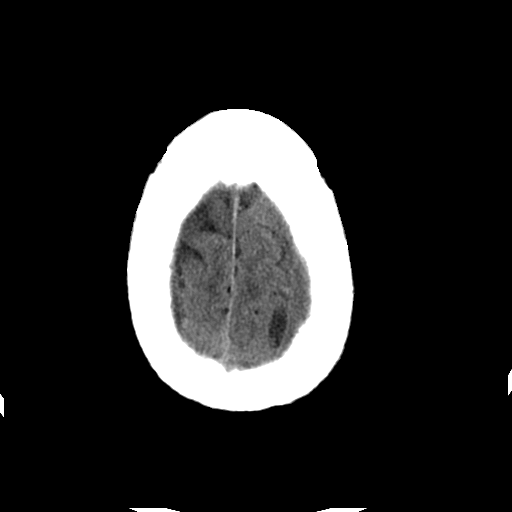
[im 28/31  brain]
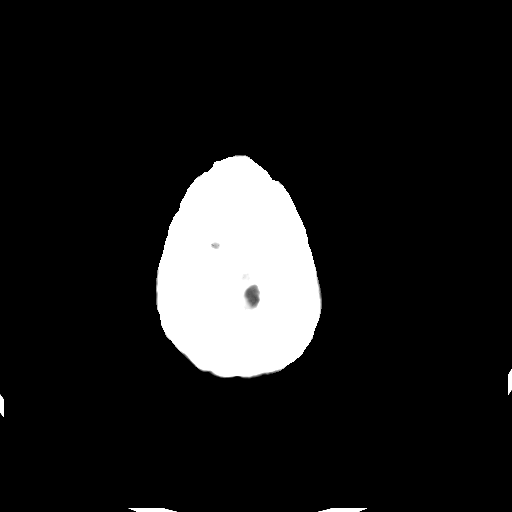

[Series 203: coronal st, idose (1) · coronal · 0.40mm/px · 3 of 72 slices shown]
[im 24/72  brain]
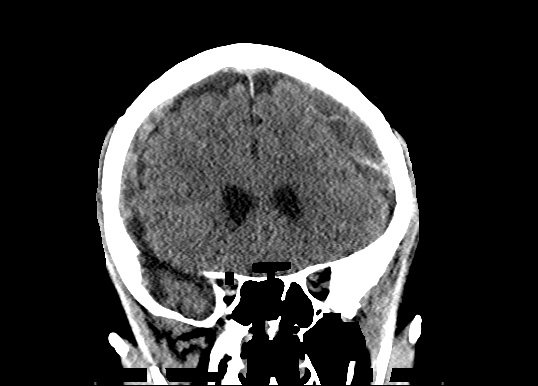
[im 32/72  brain]
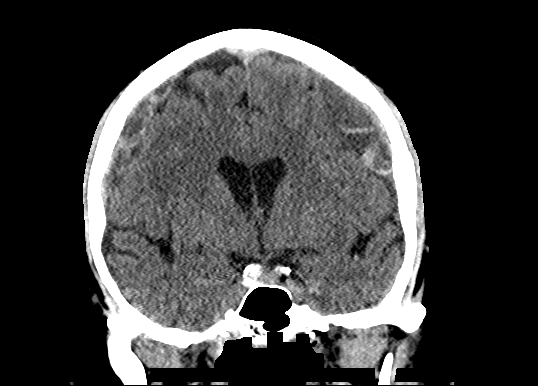
[im 40/72  brain]
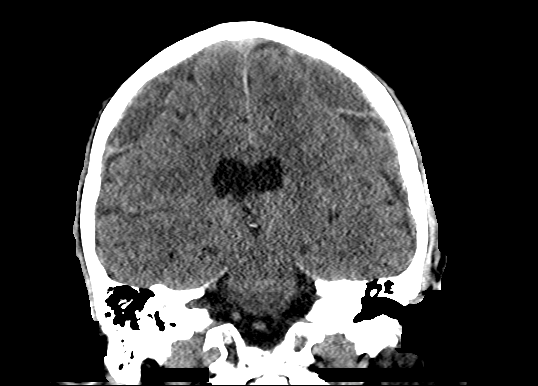

[Series 204: sagittal st, idose (1) · sagittal · 0.40mm/px · 3 of 72 slices shown]
[im 24/72  brain]
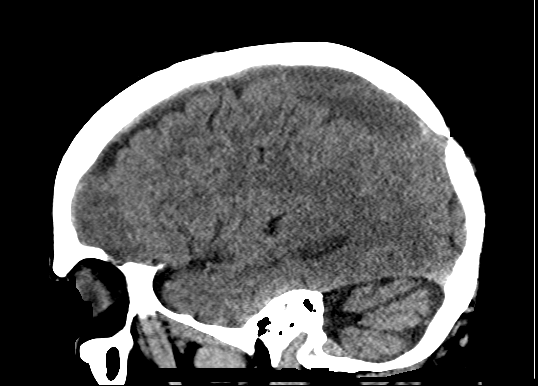
[im 36/72  brain]
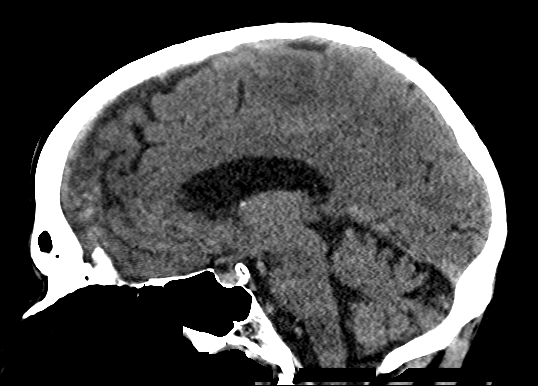
[im 48/72  brain]
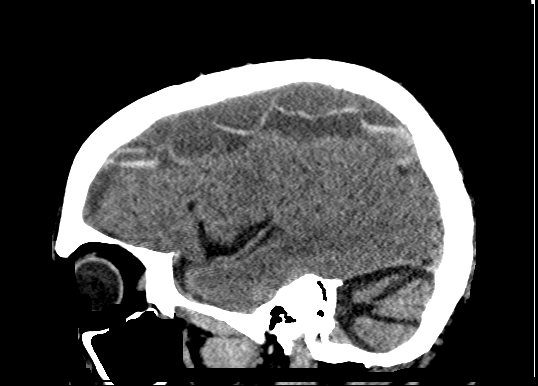

[17 of 47 positions shown; findings below may reference images not displayed]

FINDINGS: BRAIN: Bilateral holo hemispheric heterogeneously dense subdural
hematomas measuring up to 16 mm on the RIGHT, 15 mm on the LEFT.
Linear possible membrane formation bilaterally. Subjacent sulcal
effacement. No midline shift. No intraparenchymal hemorrhage or
acute large vascular territory infarcts. Basal cisterns are patent.
RIGHT cerebellar encephalomalacia.

VASCULAR: Moderate calcific atherosclerosis of the carotid siphons.

SKULL: No skull fracture. No significant scalp soft tissue swelling.

SINUSES/ORBITS: The included ocular globes and orbital contents are
non-suspicious. Status post bilateral ocular lens implants. The
mastoid air-cells and included paranasal sinuses are well-aerated.

OTHER: None.
IMPRESSION: Acute on chronic large bilateral holo hemispheric subdural hematomas
with similar mass effect, no midline shift.

RIGHT cerebellar encephalomalacia suggesting old infarct.

Acute findings discussed with and reconfirmed by Dr.AL IGNACIO

## 2019-12-20 ENCOUNTER — Encounter: Payer: Self-pay | Admitting: *Deleted

## 2019-12-21 ENCOUNTER — Ambulatory Visit (INDEPENDENT_AMBULATORY_CARE_PROVIDER_SITE_OTHER): Payer: Medicare Other | Admitting: Neurology

## 2019-12-21 ENCOUNTER — Encounter: Payer: Self-pay | Admitting: Neurology

## 2019-12-21 ENCOUNTER — Other Ambulatory Visit: Payer: Self-pay

## 2019-12-21 VITALS — BP 146/75 | HR 66 | Ht 69.0 in | Wt 149.5 lb

## 2019-12-21 DIAGNOSIS — E538 Deficiency of other specified B group vitamins: Secondary | ICD-10-CM

## 2019-12-21 DIAGNOSIS — R443 Hallucinations, unspecified: Secondary | ICD-10-CM | POA: Diagnosis not present

## 2019-12-21 DIAGNOSIS — R269 Unspecified abnormalities of gait and mobility: Secondary | ICD-10-CM

## 2019-12-21 HISTORY — DX: Hallucinations, unspecified: R44.3

## 2019-12-21 NOTE — Progress Notes (Signed)
Reason for visit: Hallucinations, tremor  Referring physician: Dr. Aundra Millet Benjamin [name] is a 85 y.o. male  History of present illness:  Benjamin [name] is an 85 year old left-handed white male with a history of bilateral subdural hematoma, seen through this office in 2017.  The patient comes into the office today with a new problem.  Over the last several months, within the last 6 months he has had some alteration in his ability to walk and has had some problems with hallucinations.  The patient has had 1 fall over the last several months, he is not using a cane for ambulation.  His family has noted occasional tremors involving the right upper extremity only, but the tremors are noted when using the arm, not while resting the arm.  The patient has begun having hallucinations that occur mainly in the evening hours and the hallucinations are visual only.  The patient has had some mild weight loss of about 10 pounds over the last several months, he was placed on Aricept several weeks ago but began having some dizziness and the Aricept was stopped.  The patient reports some dizziness with standing, he has not had any syncopal events.  He has apparently had a recent MRI of the brain and a CT scan of the brain done through [facility] imaging in [place], he was told that these studies were unremarkable, I have not seen the report of the studies.  The patient has a chronic history of double vision that has been present for several years.  He does not operate a motor vehicle.  He apparently keeps up with his own medications but requires some assistance with keeping up with appointments.  He reports that the bowels and bladder are working well.  He denies any neck pain or low back pain or pain down the arms or legs.  At times, he has had confusion early in the morning, he may not recognize that he is at home, he has called his son to come help him get back home when he was already at his house.  The  patient reports no sensory changes, he feels possibly slightly weak in the legs.  He is sent to this office for an evaluation.   Past Medical History:  Diagnosis Date  . BPH (benign prostatic hyperplasia)   . Calculus of kidney   . Glaucoma   . High cholesterol   . Hypertension   . Polycythemia   . Post concussion syndrome 11/07/2015  . Subdural hematoma (HCC) 11/07/2015    Past Surgical History:  Procedure Laterality Date  . BRAIN SURGERY  1949  . BURR HOLE Bilateral 11/15/2015   Procedure: [name];  Surgeon: Donalee Citrin, MD;  Location: [facility];  Service: Neurosurgery;  Laterality: Bilateral;  . CATARACT EXTRACTION  2013-14  . COLONOSCOPY    . KIDNEY STONE SURGERY  2000  . nasal polyps    . PAROTIDECTOMY    . REFRACTIVE SURGERY    . TONSILLECTOMY  1944  . VEIN LIGATION  2016    Family History  Problem Relation Age of Onset  . High blood pressure Mother   . High Cholesterol Mother   . Heart attack Father     Social history:  reports that he has never smoked. He has never used smokeless tobacco. He reports that he does not drink alcohol and does not use drugs.  Medications:  Prior to Admission medications   Medication Sig Start Date End Date Taking?  Authorizing Provider  amLODipine (NORVASC) 2.5 MG tablet Take 2.5 mg by mouth every morning.  08/12/15  Yes [provider]  Brinzolamide-Brimonidine (SIMBRINZA) 1-0.2 % SUSP Apply 1 drop to eye as directed. One drop into affected eye daily.   Yes [provider]  lisinopril (ZESTRIL) 10 MG tablet Take 10 mg by mouth daily.   Yes [provider]  rosuvastatin (CRESTOR) 10 MG tablet Take 10 mg by mouth daily.   Yes [provider]  timolol (BETIMOL) 0.5 % ophthalmic solution 1 drop 2 (two) times daily. One drop into affected eye daily.   Yes [provider]  Travoprost, BAK Free, (TRAVATAN Z) 0.004 % SOLN ophthalmic solution 1 drop at bedtime. One drop into affected eye every evening.    Yes [provider]     No Known Allergies  ROS:  Out of a complete 14 system review of symptoms, the patient complains only of the following symptoms, and all other reviewed systems are negative.  Hallucinations Walking difficulty Tremor Dizziness  Blood pressure (!) 146/75, pulse 66, height 5\' 9"  (1.753 m), weight 149 lb 8 oz (67.8 kg).  Blood pressure, right arm, sitting is 138/60.  Blood pressure, right arm, standing is 128/60.  Physical Exam  General: The patient is alert and cooperative at the time of the examination.  Eyes: Pupils are equal, round, and reactive to light. Discs are flat bilaterally.  Neck: The neck is supple, no carotid bruits are noted.  Respiratory: The respiratory examination is clear.  Cardiovascular: The cardiovascular examination reveals a regular rate and rhythm, with a grade I/VI systolic ejection murmur at the aortic area.  Skin: Extremities are without significant edema.  Neurologic Exam  Mental status: The patient is alert and oriented x 3 at the time of the examination. The Mini-Mental status examination done today shows a total score of 24/30.  The patient is able to name 10 four-legged animals in 60 seconds.  Cranial nerves: Facial symmetry is present. There is good sensation of the face to pinprick and soft touch bilaterally. The strength of the facial muscles and the muscles to head turning and shoulder shrug are normal bilaterally. Speech is well enunciated, no aphasia or dysarthria is noted. Extraocular movements are full. Visual fields are full. The tongue is midline, and the patient has symmetric elevation of the soft palate. No obvious hearing deficits are noted.  Some mild masking the face is noted.  Motor: The motor testing reveals 5 over 5 strength of all 4 extremities. Good symmetric motor tone is noted throughout.  Sensory: Sensory testing is intact to pinprick, soft touch and vibration sensation on all 4 extremities.   There seems to be some reduction of position sense of all 4 extremities.  No evidence of extinction is noted.  Coordination: Cerebellar testing reveals good finger-nose-finger and heel-to-shin bilaterally, but there is some mild apraxia with use of the extremities.  Gait and station: The patient is able to rise from a seated position with arms crossed.  Once up, he can walk independently, he seems to have arm swing bilaterally, possibly slightly reduced on the right.  With turns, he is unstable, he had a tendency to fall backwards.  The patient has an unsteady tandem gait.  Romberg is negative.  No drift is seen.  Reflexes: Deep tendon reflexes are symmetric and normal bilaterally, with exception of some reduction of the ankle jerk reflexes bilaterally. Toes are downgoing bilaterally.   Assessment/Plan:  1.  Gait disorder  2.  Hallucinations  3.  Reported tremor  The patient does have some mild masking of the face, he has a history of some drooling that has occurred, but I do not see enough on his clinical examination to confirm the diagnosis of Parkinson's disease.  With the history of hallucinations in the evening hours, and confusion at times, the patient likely is developing an underlying dementia.  The patient will be followed for this issue.  The patient reports dizziness with standing but no orthostasis is noted.  I will get the report of the MRI of the brain done previously.  The patient was set up for blood work today and have physical therapy done.  He will follow up here in 4 months.  Marlan Palau MD 12/21/2019 10:02 AM  [facility] [address] [place], [place] [address]  Phone [phone] Fax [phone]

## 2019-12-25 ENCOUNTER — Telehealth: Payer: Self-pay | Admitting: Emergency Medicine

## 2019-12-25 LAB — SEDIMENTATION RATE: Sed Rate: 2 mm/hr (ref 0–30)

## 2019-12-25 LAB — RPR: RPR Ser Ql: NONREACTIVE

## 2019-12-25 LAB — COPPER, SERUM: Copper: 59 ug/dL — ABNORMAL LOW (ref 69–132)

## 2019-12-25 LAB — VITAMIN B12: Vitamin B-12: 430 pg/mL (ref 232–1245)

## 2019-12-25 NOTE — Telephone Encounter (Signed)
Called and spoke to patient's wife (on [place]) regarding lab results and Dr. Clarisa Kindred recommendation.  Wife was able to return verbalization on picking up a multi vitamin with at least 2gm of copper and avoid any extra zinc.  She expressed appreciation for the call.

## 2019-12-31 ENCOUNTER — Telehealth: Payer: Self-pay | Admitting: Neurology

## 2019-12-31 NOTE — Telephone Encounter (Signed)
I have received the report of the MRI of the brain done at [facility] imaging on 20 November 2019.   Impression:  1.  No acute intracranial pathology 2.  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 3.  Mild chronic small vessel ischemic changes and moderate generalized cerebral atrophy. 4.  Prior bifrontal burr holes.

## 2020-05-07 ENCOUNTER — Ambulatory Visit: Payer: Medicare Other | Admitting: Neurology

## 2023-05-07 DEATH — deceased
